# Patient Record
Sex: Female | Born: 1965 | Race: White | Hispanic: No | Marital: Married | Smoking: Current every day smoker
Health system: Southern US, Community
[De-identification: ages and names within clinical notes are randomized; demographics above are authoritative.]

## PROBLEM LIST (undated history)

## (undated) DIAGNOSIS — C801 Malignant (primary) neoplasm, unspecified: Secondary | ICD-10-CM

## (undated) DIAGNOSIS — R519 Headache, unspecified: Secondary | ICD-10-CM

## (undated) DIAGNOSIS — R51 Headache: Secondary | ICD-10-CM

## (undated) DIAGNOSIS — F32A Depression, unspecified: Secondary | ICD-10-CM

## (undated) DIAGNOSIS — I10 Essential (primary) hypertension: Secondary | ICD-10-CM

## (undated) DIAGNOSIS — T7840XA Allergy, unspecified, initial encounter: Secondary | ICD-10-CM

## (undated) DIAGNOSIS — G43909 Migraine, unspecified, not intractable, without status migrainosus: Secondary | ICD-10-CM

## (undated) DIAGNOSIS — J45909 Unspecified asthma, uncomplicated: Secondary | ICD-10-CM

## (undated) DIAGNOSIS — F329 Major depressive disorder, single episode, unspecified: Secondary | ICD-10-CM

## (undated) HISTORY — DX: Headache, unspecified: R51.9

## (undated) HISTORY — DX: Allergy, unspecified, initial encounter: T78.40XA

## (undated) HISTORY — DX: Migraine, unspecified, not intractable, without status migrainosus: G43.909

## (undated) HISTORY — DX: Headache: R51

## (undated) HISTORY — DX: Unspecified asthma, uncomplicated: J45.909

## (undated) HISTORY — DX: Depression, unspecified: F32.A

## (undated) HISTORY — DX: Major depressive disorder, single episode, unspecified: F32.9

---

## 1999-11-18 HISTORY — PX: CHOLECYSTECTOMY: SHX55

## 2009-07-05 ENCOUNTER — Ambulatory Visit: Payer: Self-pay | Admitting: Unknown Physician Specialty

## 2010-07-24 ENCOUNTER — Ambulatory Visit: Payer: Self-pay | Admitting: Unknown Physician Specialty

## 2011-10-22 ENCOUNTER — Ambulatory Visit: Payer: Self-pay | Admitting: Family Medicine

## 2012-10-26 ENCOUNTER — Ambulatory Visit: Payer: Self-pay | Admitting: Family Medicine

## 2012-11-29 ENCOUNTER — Ambulatory Visit: Payer: Self-pay

## 2012-12-09 ENCOUNTER — Ambulatory Visit: Payer: Self-pay

## 2014-12-18 LAB — HM PAP SMEAR: HM PAP: NORMAL

## 2014-12-21 LAB — HM MAMMOGRAPHY: HM Mammogram: NORMAL

## 2015-03-09 NOTE — Op Note (Signed)
PATIENT NAME:  Vickie Waters, BRINKER MR#:  [id number] DATE OF BIRTH:  [birth year]  DATE OF PROCEDURE:  12/09/2012  PREOPERATIVE DIAGNOSIS: Retained lost IUD.   POSTOPERATIVE DIAGNOSIS: Retained lost IUD.   PROCEDURES PERFORMED:  1. Dilation and curettage.  2. Hysteroscopy.   SURGEON: Wonda Cheng. Laurey Morale, M.D.   OPERATIVE FINDINGS: IUD in situ.   DESCRIPTION OF PROCEDURE: After adequate general anesthesia, the patient was prepped and draped in routine fashion. The cervix was grasped with a Jacob's tenaculum and descended to near introitus with traction. The cervix was dilated with ease. The uterine cavity was visualized with the findings of the IUD. IUD was removed. Uterine cavity was systematically curetted with return of a small amount of normal appearing tissue. The patient tolerated the procedure well and left the operating room in good condition. Sponge and needle counts were said to be correct at the end of the procedure.  ____________________________ Wonda Cheng. Laurey Morale, MD pjr:sb D: 12/09/2012 08:14:16 ET T: 12/09/2012 08:53:04 ET JOB#: [id number]  cc: Wonda Cheng. Laurey Morale, MD, <Dictator> Rosina Lowenstein MD ELECTRONICALLY SIGNED 12/22/2012 8:13

## 2015-07-12 ENCOUNTER — Encounter: Payer: Self-pay | Admitting: Primary Care

## 2015-07-12 ENCOUNTER — Ambulatory Visit (INDEPENDENT_AMBULATORY_CARE_PROVIDER_SITE_OTHER): Payer: BLUE CROSS/BLUE SHIELD | Admitting: Primary Care

## 2015-07-12 ENCOUNTER — Encounter (INDEPENDENT_AMBULATORY_CARE_PROVIDER_SITE_OTHER): Payer: Self-pay

## 2015-07-12 VITALS — BP 124/84 | HR 67 | Temp 98.5°F | Ht 65.5 in | Wt 214.8 lb

## 2015-07-12 DIAGNOSIS — F418 Other specified anxiety disorders: Secondary | ICD-10-CM

## 2015-07-12 DIAGNOSIS — F419 Anxiety disorder, unspecified: Principal | ICD-10-CM

## 2015-07-12 DIAGNOSIS — Z720 Tobacco use: Secondary | ICD-10-CM | POA: Diagnosis not present

## 2015-07-12 DIAGNOSIS — F329 Major depressive disorder, single episode, unspecified: Secondary | ICD-10-CM | POA: Insufficient documentation

## 2015-07-12 MED ORDER — CITALOPRAM HYDROBROMIDE 40 MG PO TABS: 40.0000 mg | ORAL_TABLET | Freq: Every day | ORAL | Status: DC

## 2015-07-12 MED ORDER — VENLAFAXINE HCL ER 37.5 MG PO CP24: 37.5000 mg | ORAL_CAPSULE | Freq: Every day | ORAL | Status: DC
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
## 2015-07-12 NOTE — Progress Notes (Signed)
Pre visit review using our clinic review tool, if applicable. No additional management support is needed unless otherwise documented below in the visit note. 

## 2015-07-12 NOTE — Patient Instructions (Signed)
Refills of your citalopram have been sent to the pharmacy.  Start Effexor (Venalfaxine) XR tablets for anxiety and depression. Take 1 tablet by mouth daily.  Follow up in 6 weeks for re-evaluation of anxiety and depression.  It was a pleasure to meet you today! Please don't hesitate to call me with any questions. Welcome to [facility]!

## 2015-07-12 NOTE — Assessment & Plan Note (Signed)
Smokes 1 PPD x 30 years. Is not interested in quitting at this time. Discussed the negative effects that this has on her health. Will continue to revisit.
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
## 2015-07-31 ENCOUNTER — Encounter: Payer: Self-pay | Admitting: Primary Care

## 2015-08-21 ENCOUNTER — Telehealth: Payer: Self-pay | Admitting: Primary Care

## 2015-08-21 NOTE — Telephone Encounter (Signed)
I see that she stopped the Effexor due to side effects, is her anxiety better now with just the citalopram?

## 2015-08-21 NOTE — Telephone Encounter (Signed)
Left a detail message for patient to call back.

## 2015-08-21 NOTE — Telephone Encounter (Signed)
Patient called to cancel her follow up appointment on Thursday. It was a follow up for new medication. Patient tried Effexor and it upset her stomach and she had headaches and nausea.  Patient stopped taking it after 4 days.

## 2015-08-22 ENCOUNTER — Telehealth: Payer: Self-pay | Admitting: Primary Care

## 2015-08-22 NOTE — Telephone Encounter (Signed)
Noted  

## 2015-08-22 NOTE — Telephone Encounter (Signed)
Called and ask patient regarding her anxiety. Patient stated that she is ok for now and will let us know if any changes.

## 2015-08-23 ENCOUNTER — Ambulatory Visit: Payer: BLUE CROSS/BLUE SHIELD | Admitting: Primary Care

## 2015-08-23 ENCOUNTER — Encounter: Payer: Self-pay | Admitting: Primary Care

## 2015-08-23 ENCOUNTER — Ambulatory Visit (INDEPENDENT_AMBULATORY_CARE_PROVIDER_SITE_OTHER): Payer: BLUE CROSS/BLUE SHIELD | Admitting: Primary Care

## 2015-08-23 VITALS — BP 164/108 | HR 72 | Temp 97.6°F | Ht 65.5 in | Wt 216.8 lb

## 2015-08-23 DIAGNOSIS — F418 Other specified anxiety disorders: Secondary | ICD-10-CM | POA: Diagnosis not present

## 2015-08-23 DIAGNOSIS — F32A Depression, unspecified: Secondary | ICD-10-CM

## 2015-08-23 DIAGNOSIS — R002 Palpitations: Secondary | ICD-10-CM

## 2015-08-23 DIAGNOSIS — Z720 Tobacco use: Secondary | ICD-10-CM

## 2015-08-23 DIAGNOSIS — F419 Anxiety disorder, unspecified: Secondary | ICD-10-CM

## 2015-08-23 DIAGNOSIS — F329 Major depressive disorder, single episode, unspecified: Secondary | ICD-10-CM

## 2015-08-23 DIAGNOSIS — I10 Essential (primary) hypertension: Secondary | ICD-10-CM | POA: Insufficient documentation

## 2015-08-23 MED ORDER — METOPROLOL SUCCINATE ER 25 MG PO TB24: 25.0000 mg | ORAL_TABLET | Freq: Every day | ORAL | Status: DC

## 2015-08-23 NOTE — Patient Instructions (Signed)
Your ECG looks good.  Start metoprolol succinate ER 25 mg for blood pressure and anxiety. Take 1 tablet by mouth everyday.  Check your blood pressure daily, around the same time of day, for the next 2 weeks. Ensure that you have rested for 30 minutes prior to checking your blood pressure. Record your readings and bring them to your next visit.  Follow up in 2 weeks for re-evaluation of blood pressure.  It was a pleasure to see you today!

## 2015-08-23 NOTE — Assessment & Plan Note (Addendum)
BP ranging from 160-180/90-100's over the past 3 weeks. Recent stress in her family with mother and husband. Also with palpitations intermittently which are likely stress related and possibly due to smoking. ECG performed today and is unremarkable. Will treat today with low dose beta blocker to help with anxiety, BP, and palpitations. Will watch HR. Follow up in 2 weeks for re-evaluation.

## 2015-08-23 NOTE — Progress Notes (Signed)
Pre visit review using our clinic review tool, if applicable. No additional management support is needed unless otherwise documented below in the visit note. 

## 2015-08-23 NOTE — Assessment & Plan Note (Signed)
Discussed today, she is not ready to quit.
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
## 2015-08-23 NOTE — Assessment & Plan Note (Signed)
No improvement with Effexor as she did not like the way she felt, only took for 4 days. Anxious today during exam. Recent stress with her mother and husband. BP elevated over past several weeks. Added toprol XL 25 mg to her regimen to help with palpitations, anxiety, and BP. Follow up in 2 weeks for re-evaluation.

## 2015-09-05 ENCOUNTER — Telehealth: Payer: Self-pay | Admitting: Primary Care
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
## 2015-09-05 NOTE — Telephone Encounter (Signed)
I'm happy to help her. She was supposed to follow up 2 weeks from the 6th for BP recheck and anxiety follow up. She will need to schedule an office visit for treatment. Thanks.

## 2015-09-05 NOTE — Telephone Encounter (Signed)
Called and notified patient of Kate's comments. Patient verbalized understanding.  

## 2015-09-06 ENCOUNTER — Ambulatory Visit: Payer: BLUE CROSS/BLUE SHIELD | Admitting: Primary Care

## 2015-09-14 ENCOUNTER — Ambulatory Visit (INDEPENDENT_AMBULATORY_CARE_PROVIDER_SITE_OTHER): Payer: BLUE CROSS/BLUE SHIELD | Admitting: Primary Care

## 2015-09-14 ENCOUNTER — Encounter: Payer: Self-pay | Admitting: Primary Care

## 2015-09-14 VITALS — BP 148/94 | HR 62 | Temp 97.6°F | Ht 65.5 in | Wt 210.1 lb

## 2015-09-14 DIAGNOSIS — F419 Anxiety disorder, unspecified: Secondary | ICD-10-CM

## 2015-09-14 DIAGNOSIS — I10 Essential (primary) hypertension: Secondary | ICD-10-CM

## 2015-09-14 DIAGNOSIS — F418 Other specified anxiety disorders: Secondary | ICD-10-CM | POA: Diagnosis not present

## 2015-09-14 DIAGNOSIS — F329 Major depressive disorder, single episode, unspecified: Secondary | ICD-10-CM

## 2015-09-14 DIAGNOSIS — F411 Generalized anxiety disorder: Secondary | ICD-10-CM

## 2015-09-14 MED ORDER — ALPRAZOLAM 0.5 MG PO TBDP: 0.5000 mg | ORAL_TABLET | Freq: Two times a day (BID) | ORAL | Status: DC | PRN

## 2015-09-14 MED ORDER — ALPRAZOLAM 0.5 MG PO TABS: 0.5000 mg | ORAL_TABLET | Freq: Two times a day (BID) | ORAL | Status: DC | PRN

## 2015-09-14 NOTE — Assessment & Plan Note (Signed)
Seems improved with Toprol XL based off of home readings. Slightly above goal today. Will have her send me 2 weeks of BP results in 2 weeks. If elevated, will consider adding additional medication. I believe her BP is elevated due to anxiety.

## 2015-09-14 NOTE — Patient Instructions (Addendum)
Continue taking Citalopram for anxiety and depression. Start Alprazolam 0.5 mg tablets for anxiety. Take 1 tablet by mouth twice daily as needed.  Check your blood pressure daily, around the same time of day, for the next 2 weeks.   Ensure that you have rested for 30 minutes prior to checking your blood pressure. Record your readings and send them to me on MyChart in 2 weeks.  Follow up in 2 months for re-evaluation.  It was a pleasure to see you today! Marland Kitchen
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
## 2015-11-15 ENCOUNTER — Encounter: Payer: Self-pay | Admitting: Radiology

## 2015-11-15 ENCOUNTER — Ambulatory Visit (INDEPENDENT_AMBULATORY_CARE_PROVIDER_SITE_OTHER): Payer: BLUE CROSS/BLUE SHIELD | Admitting: Primary Care

## 2015-11-15 ENCOUNTER — Encounter: Payer: Self-pay | Admitting: Primary Care

## 2015-11-15 VITALS — BP 116/62 | HR 84 | Temp 97.7°F | Wt 220.8 lb

## 2015-11-15 DIAGNOSIS — F411 Generalized anxiety disorder: Secondary | ICD-10-CM | POA: Diagnosis not present

## 2015-11-15 DIAGNOSIS — Z720 Tobacco use: Secondary | ICD-10-CM | POA: Diagnosis not present

## 2015-11-15 DIAGNOSIS — G43101 Migraine with aura, not intractable, with status migrainosus: Secondary | ICD-10-CM | POA: Diagnosis not present

## 2015-11-15 DIAGNOSIS — F418 Other specified anxiety disorders: Secondary | ICD-10-CM

## 2015-11-15 DIAGNOSIS — I10 Essential (primary) hypertension: Secondary | ICD-10-CM | POA: Diagnosis not present

## 2015-11-15 DIAGNOSIS — F329 Major depressive disorder, single episode, unspecified: Secondary | ICD-10-CM

## 2015-11-15 DIAGNOSIS — F419 Anxiety disorder, unspecified: Secondary | ICD-10-CM

## 2015-11-15 MED ORDER — ALPRAZOLAM 0.5 MG PO TABS: 0.5000 mg | ORAL_TABLET | Freq: Two times a day (BID) | ORAL | Status: DC | PRN

## 2015-11-15 MED ORDER — TOPIRAMATE 25 MG PO TABS: 25.0000 mg | ORAL_TABLET | Freq: Every day | ORAL | Status: DC

## 2015-11-15 NOTE — Assessment & Plan Note (Signed)
Stable on metoprolol succinate.  Continue same, BP and HR stable.

## 2015-11-15 NOTE — Progress Notes (Signed)
Pre visit review using our clinic review tool, if applicable. No additional management support is needed unless otherwise documented below in the visit note. 

## 2015-11-15 NOTE — Assessment & Plan Note (Signed)
Much improved since addition of PRN xanax. Uses Xanax 2-3 times weekly. Continue Citalopram 40 mg daily. UDS due today. Refill provided.

## 2015-11-15 NOTE — Assessment & Plan Note (Signed)
Stopped smoking 1 month ago, quit cold [place]. Offered my assistance if she had cravings, she agreed to notify me if needed.
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
## 2015-11-15 NOTE — Patient Instructions (Addendum)
Start Topiramate tablets for migraine prevention. Take 1 tablet by mouth every evening at bedtime.  You may continue to take the Xanax as needed for anxiety. Continue taking Citalopram 40 mg for anxiety.  Continue metoprolol succinate 25 mg daily.  Please message me in 4 weeks to update me on your migraines.  Please schedule a physical with me in March 2017. You may also schedule a lab only appointment 3-4 days prior. We will discuss your lab results in detail during your physical.  It was a pleasure to see you today!
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
## 2015-12-04 ENCOUNTER — Encounter: Payer: Self-pay | Admitting: Primary Care

## 2015-12-17 ENCOUNTER — Telehealth: Payer: Self-pay | Admitting: Primary Care

## 2015-12-17 NOTE — Telephone Encounter (Signed)
Message left for patient to return my call.  

## 2015-12-17 NOTE — Telephone Encounter (Signed)
Will you call and check on Vickie Waters. How are her migraines/headaches since we started the topamax?

## 2015-12-17 NOTE — Telephone Encounter (Signed)
Called patient earlier today. Patient stated she is on her way to a doctor appointment and cannot really talk at the moment. However, she did mention that she is doing better with the topamax but may need an increase. She will let us know for sure later when she has time.

## 2016-01-04 ENCOUNTER — Other Ambulatory Visit: Payer: Self-pay | Admitting: Primary Care

## 2016-01-04 DIAGNOSIS — I10 Essential (primary) hypertension: Secondary | ICD-10-CM

## 2016-01-04 NOTE — Telephone Encounter (Signed)
Electronically refill request for   metoprolol succinate (TOPROL XL) 25 MG 24 hr tablet   Take 1 tablet (25 mg total) by mouth daily.  Dispense: 30 tablet   Refills: 3     Last prescribed on  08/23/2015. Last seen on 11/15/15. No future appointment.

## 2016-01-14 ENCOUNTER — Other Ambulatory Visit: Payer: Self-pay | Admitting: Primary Care

## 2016-01-14 DIAGNOSIS — Z1231 Encounter for screening mammogram for malignant neoplasm of breast: Secondary | ICD-10-CM

## 2016-02-14 ENCOUNTER — Other Ambulatory Visit: Payer: Self-pay | Admitting: Primary Care

## 2016-02-14 NOTE — Telephone Encounter (Signed)
Electronically refill request for   citalopram (CELEXA) 40 MG tablet   Take 1 tablet (40 mg total) by mouth daily.  Dispense: 30 tablet   Refills: 5     Last prescribed on  07/12/2015. Last seen on 11/15/2015. CPE on 03/04/2016.

## 2016-02-24 ENCOUNTER — Other Ambulatory Visit: Payer: Self-pay | Admitting: Primary Care

## 2016-02-24 DIAGNOSIS — Z0000 Encounter for general adult medical examination without abnormal findings: Secondary | ICD-10-CM

## 2016-02-24 DIAGNOSIS — I10 Essential (primary) hypertension: Secondary | ICD-10-CM

## 2016-02-25 ENCOUNTER — Ambulatory Visit
Admission: RE | Admit: 2016-02-25 | Discharge: 2016-02-25 | Disposition: A | Discharge status: Home / Self Care | Payer: Managed Care, Other (non HMO) | Source: Ambulatory Visit | Attending: Primary Care | Admitting: Primary Care

## 2016-02-25 DIAGNOSIS — Z1231 Encounter for screening mammogram for malignant neoplasm of breast: Secondary | ICD-10-CM | POA: Insufficient documentation

## 2016-02-27 ENCOUNTER — Other Ambulatory Visit: Payer: BLUE CROSS/BLUE SHIELD

## 2016-02-27 ENCOUNTER — Other Ambulatory Visit (INDEPENDENT_AMBULATORY_CARE_PROVIDER_SITE_OTHER): Payer: Managed Care, Other (non HMO)

## 2016-02-27 DIAGNOSIS — Z0000 Encounter for general adult medical examination without abnormal findings: Secondary | ICD-10-CM | POA: Diagnosis not present

## 2016-02-27 DIAGNOSIS — I10 Essential (primary) hypertension: Secondary | ICD-10-CM

## 2016-02-27 LAB — COMPREHENSIVE METABOLIC PANEL
ALK PHOS: 81 U/L (ref 39–117)
ALT: 17 U/L (ref 0–35)
AST: 14 U/L (ref 0–37)
Albumin: 4.1 g/dL (ref 3.5–5.2)
BILIRUBIN TOTAL: 0.5 mg/dL (ref 0.2–1.2)
BUN: 12 mg/dL (ref 6–23)
CO2: 26 meq/L (ref 19–32)
Calcium: 9.3 mg/dL (ref 8.4–10.5)
Chloride: 106 mEq/L (ref 96–112)
Creatinine, Ser: 0.78 mg/dL (ref 0.40–1.20)
GFR: 83.08 mL/min (ref >60.00)
GLUCOSE: 112 mg/dL — AB (ref 70–99)
Potassium: 4.3 mEq/L (ref 3.5–5.1)
SODIUM: 137 meq/L (ref 135–145)
TOTAL PROTEIN: 7.3 g/dL (ref 6.0–8.3)

## 2016-02-27 LAB — CBC
HCT: 39.9 % (ref 36.0–46.0)
HEMOGLOBIN: 13.5 g/dL (ref 12.0–15.0)
MCHC: 33.8 g/dL (ref 30.0–36.0)
MCV: 89.6 fl (ref 78.0–100.0)
Platelets: 276 10*3/uL (ref 150.0–400.0)
RBC: 4.45 Mil/uL (ref 3.87–5.11)
RDW: 13.9 % (ref 11.5–15.5)
WBC: 8.2 10*3/uL (ref 4.0–10.5)

## 2016-02-27 LAB — LIPID PANEL
Cholesterol: 155 mg/dL (ref 0–200)
HDL: 37.8 mg/dL — ABNORMAL LOW (ref >39.00)
LDL Cholesterol: 99 mg/dL (ref 0–99)
NONHDL: 117.09
Total CHOL/HDL Ratio: 4
Triglycerides: 88 mg/dL (ref 0.0–149.0)
VLDL: 17.6 mg/dL (ref 0.0–40.0)

## 2016-02-27 LAB — HEMOGLOBIN A1C: HEMOGLOBIN A1C: 5.8 % (ref 4.6–6.5)

## 2016-02-27 LAB — VITAMIN D 25 HYDROXY (VIT D DEFICIENCY, FRACTURES): VITD: 26.29 ng/mL — ABNORMAL LOW (ref 30.00–100.00)

## 2016-03-04 ENCOUNTER — Encounter: Payer: Self-pay | Admitting: Primary Care

## 2016-03-04 ENCOUNTER — Other Ambulatory Visit (HOSPITAL_COMMUNITY)
Admission: RE | Admit: 2016-03-04 | Discharge: 2016-03-04 | Disposition: A | Discharge status: Home / Self Care | Payer: Managed Care, Other (non HMO) | Source: Ambulatory Visit | Attending: Primary Care | Admitting: Primary Care

## 2016-03-04 ENCOUNTER — Ambulatory Visit (INDEPENDENT_AMBULATORY_CARE_PROVIDER_SITE_OTHER): Payer: Managed Care, Other (non HMO) | Admitting: Primary Care

## 2016-03-04 VITALS — BP 122/74 | HR 67 | Temp 98.1°F | Ht 66.0 in | Wt 224.8 lb

## 2016-03-04 DIAGNOSIS — R7303 Prediabetes: Secondary | ICD-10-CM

## 2016-03-04 DIAGNOSIS — Z1151 Encounter for screening for human papillomavirus (HPV): Secondary | ICD-10-CM | POA: Insufficient documentation

## 2016-03-04 DIAGNOSIS — F418 Other specified anxiety disorders: Secondary | ICD-10-CM

## 2016-03-04 DIAGNOSIS — Z124 Encounter for screening for malignant neoplasm of cervix: Secondary | ICD-10-CM

## 2016-03-04 DIAGNOSIS — Z0001 Encounter for general adult medical examination with abnormal findings: Secondary | ICD-10-CM

## 2016-03-04 DIAGNOSIS — Z0000 Encounter for general adult medical examination without abnormal findings: Secondary | ICD-10-CM | POA: Insufficient documentation

## 2016-03-04 DIAGNOSIS — Z01419 Encounter for gynecological examination (general) (routine) without abnormal findings: Secondary | ICD-10-CM | POA: Insufficient documentation

## 2016-03-04 DIAGNOSIS — Z1211 Encounter for screening for malignant neoplasm of colon: Secondary | ICD-10-CM

## 2016-03-04 DIAGNOSIS — E559 Vitamin D deficiency, unspecified: Secondary | ICD-10-CM | POA: Insufficient documentation

## 2016-03-04 DIAGNOSIS — R011 Cardiac murmur, unspecified: Secondary | ICD-10-CM | POA: Diagnosis not present

## 2016-03-04 DIAGNOSIS — F329 Major depressive disorder, single episode, unspecified: Secondary | ICD-10-CM

## 2016-03-04 DIAGNOSIS — E119 Type 2 diabetes mellitus without complications: Secondary | ICD-10-CM | POA: Insufficient documentation

## 2016-03-04 DIAGNOSIS — R6889 Other general symptoms and signs: Secondary | ICD-10-CM

## 2016-03-04 DIAGNOSIS — I10 Essential (primary) hypertension: Secondary | ICD-10-CM | POA: Diagnosis not present

## 2016-03-04 DIAGNOSIS — F419 Anxiety disorder, unspecified: Secondary | ICD-10-CM

## 2016-03-04 DIAGNOSIS — Z720 Tobacco use: Secondary | ICD-10-CM

## 2016-03-04 NOTE — Assessment & Plan Note (Signed)
Quit smoking in November 2016, commended her on this accomplishment.

## 2016-03-04 NOTE — Assessment & Plan Note (Signed)
Stable today, continue metoprolol XL 25

## 2016-03-04 NOTE — Assessment & Plan Note (Signed)
Level of 26 today. Initiated vitamin D 1000 unit capsules daily. Recheck levels in 3 months.

## 2016-03-04 NOTE — Assessment & Plan Note (Signed)
A1C of 5.8.  Discussed the importance of a healthy diet and regular exercise in order for weight loss and to reduce risk of other medical diseases. Repeat labs in 3 months.

## 2016-03-04 NOTE — Progress Notes (Signed)
Subjective:    Patient ID: Vickie Waters, female    DOB: [birth year], 50 y.o.   MRN: [id number]  HPI  Vickie Waters is a 50 year old female who presents today for complete physical.  Immunizations: -Tetanus: Completed in 2009 -Influenza: Did not receive last season.    Diet: She endorses a fair diet. She's been working to improve her diet over the past 1 month. Breakfast: Oatmeal, 2 hard boiled eggs with toast, fruit Lunch: Salads, grilled chicken, [place] burger with veggies, salmon Dinner: Chicken, vegetables, limited bread, ham, salmon, wheat pasta, some microwave meals Snacks: Occasionally, fiber one bars, granola bars Desserts: Occasionally  Beverages: Water, diet pepsi  Exercise: She does not currently exercise.  Eye exam: Completed in November 2016 Dental exam: Completed 6 months Colonoscopy: Never completed, due. Pap Smear: Completed in February 2016, requesting one today. Mammogram: Completed in April 2017   Review of Systems  Constitutional: Negative for unexpected weight change.  HENT: Negative for rhinorrhea.   Respiratory: Negative for cough, shortness of breath and wheezing.   Cardiovascular: Negative for chest pain.  Gastrointestinal: Negative for diarrhea and constipation.  Genitourinary:       No period since October 2016  Musculoskeletal: Negative for myalgias and arthralgias.  Skin: Negative for rash.  Allergic/Immunologic: Negative for environmental allergies.  Neurological: Negative for dizziness, numbness and headaches.       Overall improved on Topamax  Psychiatric/Behavioral:       Stable on Citalopram.        Past Medical History  Diagnosis Date  . Asthma   . Depression   . Frequent headaches   . Allergy   . Migraine      Social History   Social History  . Marital Status: Married    Spouse Name: N/A  . Number of Children: N/A  . Years of Education: N/A   Occupational History  . Not on file.   Social History Main Topics  .  Smoking status: Former Smoker -- 1.00 packs/day    Types: Cigarettes    Quit date: 10/15/2015  . Smokeless tobacco: Not on file  . Alcohol Use: 0.0 oz/week    0 Standard drinks or equivalent per week     Comment: social  . Drug Use: Not on file  . Sexual Activity: Not on file   Other Topics Concern  . Not on file   Social History Narrative   Married.   2 children   Works as an Scientist, water quality.   Enjoys spending time with her family.       Past Surgical History  Procedure Laterality Date  . Cholecystectomy  2001    Family History  Problem Relation Age of Onset  . Arthritis Mother   . Hypertension Mother   . Hypertension Father   . Diabetes Father   . Mental illness Maternal Grandfather     Allergies  Allergen Reactions  . Cephalosporins Hives  . Penicillins Hives    Current Outpatient Prescriptions on File Prior to Visit  Medication Sig Dispense Refill  . citalopram (CELEXA) 40 MG tablet TAKE ONE TABLET BY MOUTH ONCE DAILY 90 tablet 0  . metoprolol succinate (TOPROL-XL) 25 MG 24 hr tablet TAKE ONE TABLET BY MOUTH ONCE DAILY 90 tablet 2  . topiramate (TOPAMAX) 25 MG tablet Take 1 tablet (25 mg total) by mouth at bedtime. 30 tablet 3  . ALPRAZolam (XANAX) 0.5 MG tablet Take 1 tablet (0.5 mg total) by mouth 2 (two) times  daily as needed for anxiety. (Patient not taking: Reported on 03/04/2016) 60 tablet 1   No current facility-administered medications on file prior to visit.    BP 122/74 mmHg  Pulse 67  Temp(Src) 98.1 F (36.7 C) (Oral)  Ht 5\' 6"  (1.676 m)  Wt 224 lb 12.8 oz (101.969 kg)  BMI 36.30 kg/m2  SpO2 97%    Objective:   Physical Exam  Constitutional: She is oriented to person, place, and time. She appears well-nourished.  HENT:  Right Ear: Tympanic membrane and ear canal normal.  Left Ear: Tympanic membrane and ear canal normal.  Nose: Nose normal.  Mouth/Throat: Oropharynx is clear and moist.  Eyes: Conjunctivae and EOM are normal.  Pupils are equal, round, and reactive to light.  Neck: Neck supple. No thyromegaly present.  Cardiovascular: Normal rate and regular rhythm.   Murmur heard. Pulmonary/Chest: Effort normal and breath sounds normal. She has no rales.  Abdominal: Soft. Bowel sounds are normal. There is no tenderness.  Genitourinary: Vagina normal and uterus normal. Cervix exhibits no motion tenderness and no discharge.  Musculoskeletal: Normal range of motion.  Lymphadenopathy:    She has no cervical adenopathy.  Neurological: She is alert and oriented to person, place, and time. She has normal reflexes. No cranial nerve deficit.  Skin: Skin is warm and dry. No rash noted.  Psychiatric: She has a normal mood and affect.          Assessment & Plan:

## 2016-03-04 NOTE — Assessment & Plan Note (Signed)
Tdap UTD. Pap completed today, mammogram normal in April 2017. Colonoscopy referral placed. Exam unremarkable except for cardiac murmur. Labs with vitamin D deficiency and borderline diabetes which were addressed. Discussed the importance of a healthy diet and regular exercise in order for weight loss and to reduce risk of other medical diseases.  Follow up in 1 year for repeat physical.  Repeat A1C and vitamin D due in 3 months.

## 2016-03-04 NOTE — Progress Notes (Signed)
Pre visit review using our clinic review tool, if applicable. No additional management support is needed unless otherwise documented below in the visit note. 

## 2016-03-04 NOTE — Assessment & Plan Note (Signed)
Noted during examination today with sitting and supine positions. No prior history. Will obtain echocardiogram for further evaluation. She is asymptomatic.

## 2016-03-04 NOTE — Patient Instructions (Addendum)
Start Vitamin D 1000 unit capsules daily for low vitamin D levels.  Stop by the front desk and speak with either Rosaria Ferries or Ebony Hail regarding your Echocardiogram.  You will be contacted regarding your referral to GI for your colonoscopy.  Please let us know if you have not heard back within one week.   Congratulations on smoking cessation and weight loss efforts.  Continue your efforts towards weight loss.  Scheduled a lab only appointment in 3 months to recheck your blood sugar levels (A1C),  We will notify you of your PAP results once received.   Follow up in 1 year for repeat physical.   It was a pleasure to see you today!

## 2016-03-04 NOTE — Addendum Note (Signed)
Addended by: Jacqualin Combes on: 03/04/2016 03:21 PM   Modules accepted: Orders, SmartSet

## 2016-03-04 NOTE — Assessment & Plan Note (Signed)
Well managed on Citalopram, no recent use of alprazolam.

## 2016-03-05 LAB — CYTOLOGY - PAP

## 2016-03-14 ENCOUNTER — Ambulatory Visit (INDEPENDENT_AMBULATORY_CARE_PROVIDER_SITE_OTHER): Payer: Managed Care, Other (non HMO)

## 2016-03-14 ENCOUNTER — Other Ambulatory Visit: Payer: Self-pay

## 2016-03-14 DIAGNOSIS — R011 Cardiac murmur, unspecified: Secondary | ICD-10-CM | POA: Diagnosis not present

## 2016-03-17 ENCOUNTER — Other Ambulatory Visit: Payer: Self-pay | Admitting: Primary Care

## 2016-03-17 NOTE — Telephone Encounter (Deleted)
Electronically refill request for   topiramate (TOPAMAX) 25 MG tablet

## 2016-04-15 ENCOUNTER — Ambulatory Visit (INDEPENDENT_AMBULATORY_CARE_PROVIDER_SITE_OTHER): Payer: Managed Care, Other (non HMO) | Admitting: Internal Medicine

## 2016-04-15 ENCOUNTER — Encounter: Payer: Self-pay | Admitting: Internal Medicine

## 2016-04-15 VITALS — BP 128/80 | HR 97 | Temp 99.0°F | Wt 223.0 lb

## 2016-04-15 DIAGNOSIS — J069 Acute upper respiratory infection, unspecified: Secondary | ICD-10-CM | POA: Diagnosis not present

## 2016-04-15 DIAGNOSIS — B9789 Other viral agents as the cause of diseases classified elsewhere: Principal | ICD-10-CM

## 2016-04-15 MED ORDER — ALBUTEROL SULFATE HFA 108 (90 BASE) MCG/ACT IN AERS: 2.0000 | INHALATION_SPRAY | Freq: Four times a day (QID) | RESPIRATORY_TRACT | Status: DC | PRN

## 2016-04-15 MED ORDER — HYDROCODONE-HOMATROPINE 5-1.5 MG/5ML PO SYRP
5.0000 mL | ORAL_SOLUTION | Freq: Three times a day (TID) | ORAL | Status: DC | PRN
Start: 2016-04-15 — End: 2016-06-06

## 2016-04-15 NOTE — Progress Notes (Signed)
Pre visit review using our clinic review tool, if applicable. No additional management support is needed unless otherwise documented below in the visit note. 

## 2016-04-15 NOTE — Patient Instructions (Signed)

## 2016-04-15 NOTE — Progress Notes (Signed)
HPI  Pt presents to the clinic today with c/o cough and shortness of breath. This started 2-3 days ago. The cough is non productive. She has noticed that she has been wheezing. She has run fever up to 100.1. She denies chills or body aches but has felt fatigued. She denies runny nose, nasal congestion, ear pain or sore throat. She has taken Mucinex and used her sones nebulizer treatment with minimal relief. She has a history of allergies and asthma. She has not had sick contacts.  Review of Systems      Past Medical History  Diagnosis Date  . Asthma   . Depression   . Frequent headaches   . Allergy   . Migraine     Family History  Problem Relation Age of Onset  . Arthritis Mother   . Hypertension Mother   . Hypertension Father   . Diabetes Father   . Mental illness Maternal Grandfather     Social History   Social History  . Marital Status: Married    Spouse Name: N/A  . Number of Children: N/A  . Years of Education: N/A   Occupational History  . Not on file.   Social History Main Topics  . Smoking status: Former Smoker -- 1.00 packs/day    Types: Cigarettes    Quit date: 10/15/2015  . Smokeless tobacco: Not on file  . Alcohol Use: 0.0 oz/week    0 Standard drinks or equivalent per week     Comment: social  . Drug Use: Not on file  . Sexual Activity: Not on file   Other Topics Concern  . Not on file   Social History Narrative   Married.   2 children   Works as an Scientist, water quality.   Enjoys spending time with her family.       Allergies  Allergen Reactions  . Cephalosporins Hives  . Penicillins Hives     Constitutional: Positive headache, fatigue and fever. Denies abrupt weight changes.  HEENT:  Denies eye redness, eye pain, pressure behind the eyes, facial pain, nasal congestion, ear pain, ringing in the ears, wax buildup, runny nose or sore throat. Respiratory: Positive cough and shortness of breath. Denies difficulty breathing.  Cardiovascular:  Denies chest pain, chest tightness, palpitations or swelling in the hands or feet.   No other specific complaints in a complete review of systems (except as listed in HPI above).  Objective:   BP 128/80 mmHg  Pulse 97  Temp(Src) 99 F (37.2 C) (Oral)  Wt 223 lb (101.152 kg)  SpO2 96%  Wt Readings from Last 3 Encounters:  04/15/16 223 lb (101.152 kg)  03/04/16 224 lb 12.8 oz (101.969 kg)  11/15/15 220 lb 12 oz (100.132 kg)     General: Appears her stated age, in NAD. HEENT: Head: normal shape and size, no sinus tenderness noted; Eyes: sclera white, no icterus, conjunctiva pink; Ears: Tm's gray and intact, normal light reflex; Throat/Mouth: Teeth present, mucosa pink and moist, no exudate noted, no lesions or ulcerations noted.  Neck: No cervical lymphadenopathy.  Cardiovascular: Normal rate and rhythm. S1,S2 noted. Murmur noted. Pulmonary/Chest: Normal effort and positive vesicular breath sounds. No respiratory distress. No wheezes, rales or ronchi noted.      Assessment & Plan:   Viral Upper Respiratory Infection with Cough:  Get some rest and drink plenty of water Try Allegra OTC eRx for Albuterol inhaler Rx for Hycodan cough syrup Return precautions given  RTC as needed or if symptoms persist.

## 2016-05-27 ENCOUNTER — Other Ambulatory Visit: Payer: Self-pay | Admitting: Primary Care

## 2016-05-27 DIAGNOSIS — E559 Vitamin D deficiency, unspecified: Secondary | ICD-10-CM

## 2016-05-27 DIAGNOSIS — R7303 Prediabetes: Secondary | ICD-10-CM

## 2016-06-03 ENCOUNTER — Other Ambulatory Visit (INDEPENDENT_AMBULATORY_CARE_PROVIDER_SITE_OTHER): Payer: Managed Care, Other (non HMO)

## 2016-06-03 DIAGNOSIS — E559 Vitamin D deficiency, unspecified: Secondary | ICD-10-CM

## 2016-06-03 DIAGNOSIS — R7303 Prediabetes: Secondary | ICD-10-CM

## 2016-06-03 LAB — VITAMIN D 25 HYDROXY (VIT D DEFICIENCY, FRACTURES): VITD: 35.09 ng/mL (ref 30.00–100.00)

## 2016-06-03 LAB — HEMOGLOBIN A1C: Hgb A1c MFr Bld: 6.6 % — ABNORMAL HIGH (ref 4.6–6.5)

## 2016-06-06 ENCOUNTER — Ambulatory Visit (INDEPENDENT_AMBULATORY_CARE_PROVIDER_SITE_OTHER): Payer: Managed Care, Other (non HMO) | Admitting: Primary Care

## 2016-06-06 ENCOUNTER — Encounter: Payer: Self-pay | Admitting: Primary Care

## 2016-06-06 VITALS — BP 118/76 | HR 70 | Temp 97.7°F | Ht 66.0 in | Wt 229.4 lb

## 2016-06-06 DIAGNOSIS — F418 Other specified anxiety disorders: Secondary | ICD-10-CM | POA: Diagnosis not present

## 2016-06-06 DIAGNOSIS — F419 Anxiety disorder, unspecified: Secondary | ICD-10-CM

## 2016-06-06 DIAGNOSIS — F329 Major depressive disorder, single episode, unspecified: Secondary | ICD-10-CM

## 2016-06-06 DIAGNOSIS — E119 Type 2 diabetes mellitus without complications: Secondary | ICD-10-CM

## 2016-06-06 MED ORDER — GLUCOSE BLOOD VI STRP: ORAL_STRIP | Status: DC

## 2016-06-06 MED ORDER — ONETOUCH FINEPOINT LANCETS MISC: Status: DC

## 2016-06-06 MED ORDER — ONETOUCH ULTRA MINI W/DEVICE KIT: PACK | Status: AC

## 2016-06-06 NOTE — Patient Instructions (Signed)
You have type 2 diabetes with an A1C of 6.6.   You can reduce this by working on improvements in your diet. Please limit carbohydrates in the form of white bread, rice, pasta, cakes, cookies, sugary drinks, etc. Increase your consumption of fresh fruits and vegetables, fruit, lean protein, whole grains.  Ensure you are consuming 64 ounces of water daily.  Start exercising. You should be getting 1 hour of moderate intensity exercise 5 days weekly.  Schedule a lab only appointment in 3 months for repeat A1C.  It was a pleasure to see you today!  Diabetes Mellitus and Food It is important for you to manage your blood sugar (glucose) level. Your blood glucose level can be greatly affected by what you eat. Eating healthier foods in the appropriate amounts throughout the day at about the same time each day will help you control your blood glucose level. It can also help slow or prevent worsening of your diabetes mellitus. Healthy eating may even help you improve the level of your blood pressure and reach or maintain a healthy weight.  General recommendations for healthful eating and cooking habits include:  Eating meals and snacks regularly. Avoid going long periods of time without eating to lose weight.  Eating a diet that consists mainly of plant-based foods, such as fruits, vegetables, nuts, legumes, and whole grains.  Using low-heat cooking methods, such as baking, instead of high-heat cooking methods, such as deep frying. Work with your dietitian to make sure you understand how to use the Nutrition Facts information on food labels. HOW CAN FOOD AFFECT ME? Carbohydrates Carbohydrates affect your blood glucose level more than any other type of food. Your dietitian will help you determine how many carbohydrates to eat at each meal and teach you how to count carbohydrates. Counting carbohydrates is important to keep your blood glucose at a healthy level, especially if you are using insulin or  taking certain medicines for diabetes mellitus. Alcohol Alcohol can cause sudden decreases in blood glucose (hypoglycemia), especially if you use insulin or take certain medicines for diabetes mellitus. Hypoglycemia can be a life-threatening condition. Symptoms of hypoglycemia (sleepiness, dizziness, and disorientation) are similar to symptoms of having too much alcohol.  If your health care provider has given you approval to drink alcohol, do so in moderation and use the following guidelines:  Women should not have more than one drink per day, and men should not have more than two drinks per day. One drink is equal to:  12 oz of beer.  5 oz of wine.  1 oz of hard liquor.  Do not drink on an empty stomach.  Keep yourself hydrated. Have water, diet soda, or unsweetened iced tea.  Regular soda, juice, and other mixers might contain a lot of carbohydrates and should be counted. WHAT FOODS ARE NOT RECOMMENDED? As you make food choices, it is important to remember that all foods are not the same. Some foods have fewer nutrients per serving than other foods, even though they might have the same number of calories or carbohydrates. It is difficult to get your body what it needs when you eat foods with fewer nutrients. Examples of foods that you should avoid that are high in calories and carbohydrates but low in nutrients include:  Trans fats (most processed foods list trans fats on the Nutrition Facts label).  Regular soda.  Juice.  Candy.  Sweets, such as cake, pie, doughnuts, and cookies.  Fried foods. WHAT FOODS CAN I EAT? Eat nutrient-rich  foods, which will nourish your body and keep you healthy. The food you should eat also will depend on several factors, including:  The calories you need.  The medicines you take.  Your weight.  Your blood glucose level.  Your blood pressure level.  Your cholesterol level. You should eat a variety of foods, including:  Protein.  Lean  cuts of meat.  Proteins low in saturated fats, such as fish, egg whites, and beans. Avoid processed meats.  Fruits and vegetables.  Fruits and vegetables that may help control blood glucose levels, such as apples, mangoes, and yams.  Dairy products.  Choose fat-free or low-fat dairy products, such as milk, yogurt, and cheese.  Grains, bread, pasta, and rice.  Choose whole grain products, such as multigrain bread, whole oats, and brown rice. These foods may help control blood pressure.  Fats.  Foods containing healthful fats, such as nuts, avocado, olive oil, canola oil, and fish. DOES EVERYONE WITH DIABETES MELLITUS HAVE THE SAME MEAL PLAN? Because every person with diabetes mellitus is different, there is not one meal plan that works for everyone. It is very important that you meet with a dietitian who will help you create a meal plan that is just right for you.   This information is not intended to replace advice given to you by your health care provider. Make sure you discuss any questions you have with your health care provider.   Document Released: 07/31/2005 Document Revised: 11/24/2014 Document Reviewed: 09/30/2013 Elsevier Interactive Patient Education 2016 [employer]  Type 2 Diabetes Mellitus, Adult Type 2 diabetes mellitus, often simply referred to as type 2 diabetes, is a long-lasting (chronic) disease. In type 2 diabetes, the pancreas does not make enough insulin (a hormone), the cells are less responsive to the insulin that is made (insulin resistance), or both. Normally, insulin moves sugars from food into the tissue cells. The tissue cells use the sugars for energy. The lack of insulin or the lack of normal response to insulin causes excess sugars to build up in the blood instead of going into the tissue cells. As a result, high blood sugar (hyperglycemia) develops. The effect of high sugar (glucose) levels can cause many complications. Type 2 diabetes was also  previously called adult-onset diabetes, but it can occur at any age.  RISK FACTORS  A person is predisposed to developing type 2 diabetes if someone in the family has the disease and also has one or more of the following primary risk factors:  Weight gain, or being overweight or obese.  An inactive lifestyle.  A history of consistently eating high-calorie foods. Maintaining a normal weight and regular physical activity can reduce the chance of developing type 2 diabetes. SYMPTOMS  A person with type 2 diabetes may not show symptoms initially. The symptoms of type 2 diabetes appear slowly. The symptoms include:  Increased thirst (polydipsia).  Increased urination (polyuria).  Increased urination during the night (nocturia).  Sudden or unexplained weight changes.  Frequent, recurring infections.  Tiredness (fatigue).  Weakness.  Vision changes, such as blurred vision.  Fruity smell to your breath.  Abdominal pain.  Nausea or vomiting.  Cuts or bruises which are slow to heal.  Tingling or numbness in the hands or feet.  An open skin wound (ulcer). DIAGNOSIS Type 2 diabetes is frequently not diagnosed until complications of diabetes are present. Type 2 diabetes is diagnosed when symptoms or complications are present and when blood glucose levels are increased. Your blood glucose level  may be checked by one or more of the following blood tests:  A fasting blood glucose test. You will not be allowed to eat for at least 8 hours before a blood sample is taken.  A random blood glucose test. Your blood glucose is checked at any time of the day regardless of when you ate.  A hemoglobin A1c blood glucose test. A hemoglobin A1c test provides information about blood glucose control over the previous 3 months.  An oral glucose tolerance test (OGTT). Your blood glucose is measured after you have not eaten (fasted) for 2 hours and then after you drink a glucose-containing  beverage. TREATMENT   You may need to take insulin or diabetes medicine daily to keep blood glucose levels in the desired range.  If you use insulin, you may need to adjust the dosage depending on the carbohydrates that you eat with each meal or snack.  Lifestyle changes are recommended as part of your treatment. These may include:  Following an individualized diet plan developed by a nutritionist or dietitian.  Exercising daily. Your health care providers will set individualized treatment goals for you based on your age, your medicines, how long you have had diabetes, and any other medical conditions you have. Generally, the goal of treatment is to maintain the following blood glucose levels:  Before meals (preprandial): 80-130 mg/dL.  After meals (postprandial): below 180 mg/dL.  A1c: less than 6.5-7%. HOME CARE INSTRUCTIONS   Have your hemoglobin A1c level checked twice a year.  Perform daily blood glucose monitoring as directed by your health care provider.  Monitor urine ketones when you are ill and as directed by your health care provider.  Take your diabetes medicine or insulin as directed by your health care provider to maintain your blood glucose levels in the desired range.  Never run out of diabetes medicine or insulin. It is needed every day.  If you are using insulin, you may need to adjust the amount of insulin given based on your intake of carbohydrates. Carbohydrates can raise blood glucose levels but need to be included in your diet. Carbohydrates provide vitamins, minerals, and fiber which are an essential part of a healthy diet. Carbohydrates are found in fruits, vegetables, whole grains, dairy products, legumes, and foods containing added sugars.  Eat healthy foods. You should make an appointment to see a registered dietitian to help you create an eating plan that is right for you.  Lose weight if you are overweight.  Carry a medical alert card or wear your  medical alert jewelry.  Carry a 15-gram carbohydrate snack with you at all times to treat low blood glucose (hypoglycemia). Some examples of 15-gram carbohydrate snacks include:  Glucose tablets, 3 or 4.  Glucose gel, 15-gram tube.  Raisins, 2 tablespoons (24 grams).  Jelly beans, 6.  Animal crackers, 8.  Regular pop, 4 ounces (120 mL).  Gummy treats, 9.  Recognize hypoglycemia. Hypoglycemia occurs with blood glucose levels of 70 mg/dL and below. The risk for hypoglycemia increases when fasting or skipping meals, during or after intense exercise, and during sleep. Hypoglycemia symptoms can include:  Tremors or shakes.  Decreased ability to concentrate.  Sweating.  Increased heart rate.  Headache.  Dry mouth.  Hunger.  Irritability.  Anxiety.  Restless sleep.  Altered speech or coordination.  Confusion.  Treat hypoglycemia promptly. If you are alert and able to safely swallow, follow the 15:15 rule:  Take 15-20 grams of rapid-acting glucose or carbohydrate. Rapid-acting options include  glucose gel, glucose tablets, or 4 ounces (120 mL) of fruit juice, regular soda, or low-fat milk.  Check your blood glucose level 15 minutes after taking the glucose.  Take 15-20 grams more of glucose if the repeat blood glucose level is still 70 mg/dL or below.  Eat a meal or snack within 1 hour once blood glucose levels return to normal.  Be alert to feeling very thirsty and urinating more frequently than usual, which are early signs of hyperglycemia. An early awareness of hyperglycemia allows for prompt treatment. Treat hyperglycemia as directed by your health care provider.  Engage in at least 150 minutes of moderate-intensity physical activity a week, spread over at least 3 days of the week or as directed by your health care provider. In addition, you should engage in resistance exercise at least 2 times a week or as directed by your health care provider. Try to spend no more  than 90 minutes at one time inactive.  Adjust your medicine and food intake as needed if you start a new exercise or sport.  Follow your sick-day plan anytime you are unable to eat or drink as usual.  Do not use any tobacco products including cigarettes, chewing tobacco, or electronic cigarettes. If you need help quitting, ask your health care provider.  Limit alcohol intake to no more than 1 drink per day for nonpregnant women and 2 drinks per day for men. You should drink alcohol only when you are also eating food. Talk with your health care provider whether alcohol is safe for you. Tell your health care provider if you drink alcohol several times a week.  Keep all follow-up visits as directed by your health care provider. This is important.  Schedule an eye exam soon after the diagnosis of type 2 diabetes and then annually.  Perform daily skin and foot care. Examine your skin and feet daily for cuts, bruises, redness, nail problems, bleeding, blisters, or sores. A foot exam by a health care provider should be done annually.  Brush your teeth and gums at least twice a day and floss at least once a day. Follow up with your dentist regularly.  Share your diabetes management plan with your workplace or school.  Keep your immunizations up to date. It is recommended that you receive a flu (influenza) vaccine every year. It is also recommended that you receive a pneumonia (pneumococcal) vaccine. If you are 88 years of age or older and have never received a pneumonia vaccine, this vaccine may be given as a series of two separate shots. Ask your health care provider which additional vaccines may be recommended.  Learn to manage stress.  Obtain ongoing diabetes education and support as needed.  Participate in or seek rehabilitation as needed to maintain or improve independence and quality of life. Request a physical or occupational therapy referral if you are having foot or hand numbness, or  difficulties with grooming, dressing, eating, or physical activity. SEEK MEDICAL CARE IF:   You are unable to eat food or drink fluids for more than 6 hours.  You have nausea and vomiting for more than 6 hours.  Your blood glucose level is over 240 mg/dL.  There is a change in mental status.  You develop an additional serious illness.  You have diarrhea for more than 6 hours.  You have been sick or have had a fever for a couple of days and are not getting better.  You have pain during any physical activity.  SEEK  IMMEDIATE MEDICAL CARE IF:  You have difficulty breathing.  You have moderate to large ketone levels.   This information is not intended to replace advice given to you by your health care provider. Make sure you discuss any questions you have with your health care provider.   Document Released: 11/03/2005 Document Revised: 07/25/2015 Document Reviewed: 06/01/2012 Elsevier Interactive Patient Education [employer].

## 2016-06-06 NOTE — Assessment & Plan Note (Signed)
New diagnosis from recent A1c of 6.6, history of gestational diabetes. Prediabetes on several prior visits. Poor diet and does not exercise. She is motivated to improve her diet and start exercising in order to lower A1c. Will defer treatment for metformin at this time and give her 3 months to reduce A1c. Recheck A1c in 3 months, if no improvement or worse will initiate metformin.. Prior lipid panel stable. We'll recheck in 3 months.

## 2016-06-06 NOTE — Assessment & Plan Note (Signed)
Handle news of new diabetes diagnosis very well and is accepting of her new condition.

## 2016-06-06 NOTE — Progress Notes (Signed)
Subjective:    Patient ID: Vickie Waters, female    DOB: 1966/07/12, 50 y.o.   MRN: [id number]  HPI  Vickie Waters is a 50 year old female who presents today to discuss diabetes. She was recently diagnosed with diabetes several days ago after a repeat A1C check of prediabetes we found 3 months ago. Her recent A1C was 6.6.   She's been on vacation numerous times over the summer and has neglected her diet. She's been eating fast food, drinking sweet tea, and has had numerous servings of ice cream and still she's. She had a history of gestational diabetes during pregnancy. Denies numbness/tingling in hands and feet. She is due for an eye exam in November.  Her diet currently consists of: Breakfast: Muffins, biscuits, donuts Lunch: Fast food, junk food Dinner: Pasta, fries, potatoes, meat, vegetables Desserts: Daily Beverages: Diet pepsi, sweet tea  Exercise: She does not currently exercise  She is motivated today to change her diet to prevent worsening diabetes. She does not wish to go on medication at this time as she believes she can control her diabetes with diet and exercise.  Review of Systems  Respiratory: Negative for shortness of breath.   Cardiovascular: Negative for chest pain.  Endocrine: Negative for polydipsia and polyuria.  Neurological: Negative for dizziness and numbness.       Past Medical History  Diagnosis Date  . Asthma   . Depression   . Frequent headaches   . Allergy   . Migraine      Social History   Social History  . Marital Status: Married    Spouse Name: N/A  . Number of Children: N/A  . Years of Education: N/A   Occupational History  . Not on file.   Social History Main Topics  . Smoking status: Former Smoker -- 1.00 packs/day    Types: Cigarettes    Quit date: 10/15/2015  . Smokeless tobacco: Not on file  . Alcohol Use: 0.0 oz/week    0 Standard drinks or equivalent per week     Comment: social  . Drug Use: Not on file  . Sexual  Activity: Not on file   Other Topics Concern  . Not on file   Social History Narrative   Married.   2 children   Works as an Scientist, water quality.   Enjoys spending time with her family.       Past Surgical History  Procedure Laterality Date  . Cholecystectomy  2001    Family History  Problem Relation Age of Onset  . Arthritis Mother   . Hypertension Mother   . Hypertension Father   . Diabetes Father   . Mental illness Maternal Grandfather     Allergies  Allergen Reactions  . Cephalosporins Hives  . Penicillins Hives    Current Outpatient Prescriptions on File Prior to Visit  Medication Sig Dispense Refill  . citalopram (CELEXA) 40 MG tablet TAKE ONE TABLET BY MOUTH ONCE DAILY 90 tablet 0  . metoprolol succinate (TOPROL-XL) 25 MG 24 hr tablet TAKE ONE TABLET BY MOUTH ONCE DAILY 90 tablet 2  . topiramate (TOPAMAX) 25 MG tablet TAKE ONE TABLET BY MOUTH AT BEDTIME 30 tablet 3   No current facility-administered medications on file prior to visit.    BP 118/76 mmHg  Pulse 70  Temp(Src) 97.7 F (36.5 C) (Oral)  Ht 5\' 6"  (1.676 m)  Wt 229 lb 6.4 oz (104.055 kg)  BMI 37.04 kg/m2  SpO2 98%  Objective:   Physical Exam  Constitutional: She appears well-nourished.  Cardiovascular: Normal rate and regular rhythm.   Pulmonary/Chest: Effort normal and breath sounds normal.  Skin: Skin is warm and dry.  Psychiatric: She has a normal mood and affect.          Assessment & Plan:

## 2016-06-06 NOTE — Progress Notes (Signed)
Pre visit review using our clinic review tool, if applicable. No additional management support is needed unless otherwise documented below in the visit note. 

## 2016-06-09 MED ORDER — ONETOUCH ULTRASOFT LANCETS MISC: 11 refills | Status: DC

## 2016-06-09 MED ORDER — LANCET DEVICE MISC: 11 refills | Status: DC

## 2016-06-09 NOTE — Addendum Note (Signed)
Addended by: Jacqualin Combes on: 06/09/2016 10:37 AM   Modules accepted: Orders

## 2016-06-09 NOTE — Addendum Note (Signed)
Addended by: Jacqualin Combes on: 06/09/2016 12:11 PM   Modules accepted: Orders

## 2016-06-16 ENCOUNTER — Other Ambulatory Visit: Payer: Self-pay | Admitting: Primary Care

## 2016-06-16 MED ORDER — ONETOUCH DELICA LANCING DEV MISC: 11 refills | Status: AC

## 2016-06-16 NOTE — Telephone Encounter (Signed)
Received faxed request for refill of OneTouch Delica 30 Gauge fine.  Already sent request in to the pharmacy.

## 2016-06-17 MED ORDER — ONETOUCH DELICA LANCETS FINE MISC: 11 refills | Status: AC

## 2016-06-17 NOTE — Telephone Encounter (Signed)
Christy from [address] left v/m received onetouch delica device not lancets; new rx for lancets sent as instructed.

## 2016-07-25 ENCOUNTER — Other Ambulatory Visit: Payer: Self-pay | Admitting: Primary Care

## 2016-08-19 ENCOUNTER — Other Ambulatory Visit: Payer: Self-pay | Admitting: Primary Care

## 2016-08-19 DIAGNOSIS — F329 Major depressive disorder, single episode, unspecified: Secondary | ICD-10-CM

## 2016-08-19 DIAGNOSIS — F419 Anxiety disorder, unspecified: Principal | ICD-10-CM

## 2016-08-19 NOTE — Telephone Encounter (Signed)
Ok to refill? Electronically refill request for   citalopram (CELEXA) 40 MG tablet  Last prescribed on 03/17/2016. Last seen on 06/06/2016.

## 2016-08-26 ENCOUNTER — Encounter: Payer: Self-pay | Admitting: Primary Care

## 2016-09-19 ENCOUNTER — Other Ambulatory Visit (INDEPENDENT_AMBULATORY_CARE_PROVIDER_SITE_OTHER): Payer: Managed Care, Other (non HMO)

## 2016-09-19 DIAGNOSIS — E119 Type 2 diabetes mellitus without complications: Secondary | ICD-10-CM | POA: Diagnosis not present

## 2016-09-19 LAB — HEMOGLOBIN A1C: Hgb A1c MFr Bld: 5.8 % (ref 4.6–6.5)

## 2016-09-22 ENCOUNTER — Telehealth: Payer: Self-pay | Admitting: Primary Care

## 2016-09-22 NOTE — Telephone Encounter (Signed)
Noted. Patient viewed results and Kate's comments on MyChart.  

## 2016-09-22 NOTE — Telephone Encounter (Signed)
Pt would like a call about labs,  she says that she does not go on my chart.   Thanks

## 2016-10-14 ENCOUNTER — Other Ambulatory Visit: Payer: Self-pay | Admitting: Primary Care

## 2016-10-14 DIAGNOSIS — I10 Essential (primary) hypertension: Secondary | ICD-10-CM

## 2016-10-31 LAB — HM DIABETES EYE EXAM

## 2016-11-07 ENCOUNTER — Other Ambulatory Visit: Payer: Self-pay | Admitting: Primary Care

## 2016-11-07 DIAGNOSIS — R519 Headache, unspecified: Secondary | ICD-10-CM

## 2016-11-07 DIAGNOSIS — R51 Headache: Principal | ICD-10-CM

## 2017-01-27 ENCOUNTER — Telehealth: Payer: Self-pay | Admitting: Primary Care

## 2017-01-27 NOTE — Telephone Encounter (Signed)
Please get more information. What's going on? Is she taking her citalopram?

## 2017-01-27 NOTE — Telephone Encounter (Signed)
Pt called, she is requesting a callback, she needs refill for alprazolam, and can not make appt.  Her husband is in hospital and she can not leave hospital.   She is requesting cb from [place]. Thanks

## 2017-01-28 ENCOUNTER — Ambulatory Visit (INDEPENDENT_AMBULATORY_CARE_PROVIDER_SITE_OTHER): Payer: Managed Care, Other (non HMO) | Admitting: Primary Care

## 2017-01-28 ENCOUNTER — Other Ambulatory Visit: Payer: Self-pay | Admitting: Primary Care

## 2017-01-28 ENCOUNTER — Encounter: Payer: Self-pay | Admitting: Primary Care

## 2017-01-28 VITALS — BP 146/96 | HR 75 | Temp 98.4°F | Ht 66.0 in | Wt 229.0 lb

## 2017-01-28 DIAGNOSIS — F32A Depression, unspecified: Secondary | ICD-10-CM

## 2017-01-28 DIAGNOSIS — F329 Major depressive disorder, single episode, unspecified: Secondary | ICD-10-CM

## 2017-01-28 DIAGNOSIS — F419 Anxiety disorder, unspecified: Principal | ICD-10-CM

## 2017-01-28 DIAGNOSIS — F418 Other specified anxiety disorders: Secondary | ICD-10-CM | POA: Diagnosis not present

## 2017-01-28 DIAGNOSIS — R519 Headache, unspecified: Secondary | ICD-10-CM

## 2017-01-28 DIAGNOSIS — R51 Headache: Principal | ICD-10-CM

## 2017-01-28 MED ORDER — BUSPIRONE HCL 7.5 MG PO TABS
7.5000 mg | ORAL_TABLET | Freq: Two times a day (BID) | ORAL | 1 refills | Status: DC
Start: 2017-01-28 — End: 2017-05-12

## 2017-01-28 MED ORDER — ALPRAZOLAM 0.25 MG PO TABS: ORAL_TABLET | ORAL | 0 refills | Status: DC

## 2017-01-28 NOTE — Telephone Encounter (Signed)
PT walked into clinic today in visible distress. She stated she called yesterday on the verge of a breakdown and hasn't heard back anything. PT is still taking her Citalopram. Only takes alprazolam PRN and states she hasn't taken one in 7 months. Her husband is in the hospital and needs to get back to him. He has been there since 02-19-23. 2023/02/19 morning husband had a massive seizure and is having a lot of stress. She states he practically died on 19-Feb-2023 morning. She has a lot on her plate and is trying to handle it all on her own. Does not want to have to go to the ER or anywhere so she can continue to be with her husband. Does not want to hurt herself or anyone else.

## 2017-01-28 NOTE — Telephone Encounter (Signed)
Office visit on 01/28/2017

## 2017-01-28 NOTE — Progress Notes (Signed)
Pre visit review using our clinic review tool, if applicable. No additional management support is needed unless otherwise documented below in the visit note. 

## 2017-01-28 NOTE — Assessment & Plan Note (Signed)
Uncontrolled over the last several months, worse this past weekend.  GAD 7 score and PHQ 9 score of 15 today.  Continue Citalopram 40 mg. Add in Buspar 7.5 mg BID. Given recent events with difficulty coping, will prescribe short term Alprazolam, discussed to use sparingly. Follow up in 3 months for re-evaluation. She contracts to safety today.

## 2017-01-28 NOTE — Telephone Encounter (Signed)
Ok to refill? Electronically refill request for topiramate (TOPAMAX) 25 MG tablet. Last prescribed on 11/07/2016.

## 2017-01-28 NOTE — Patient Instructions (Addendum)
Continue citalopram 40 mg tablets for anxiety and depression.   Start buspirone 7.5 mg tablets for anxiety, take 1 tablet by mouth twice daily.   You may take the Xanax as needed for panic attacks. Use this medication sparingly.   You MUST get some rest, you will feel better.  Please schedule a follow up visit in 3 months for re-evaluation of anxiety, depression, and diabetes.   It was a pleasure to see you today!

## 2017-01-28 NOTE — Telephone Encounter (Signed)
Patient evaluated this morning.

## 2017-01-28 NOTE — Progress Notes (Signed)
Subjective:    Patient ID: Vickie Waters, female    DOB: [birth year], 51 y.o.   MRN: [id number]  HPI  Vickie Waters is a 51 year old female with a history of anxiety and depression who presents today with a chief complaint of anxiety and depression. Her husband recently suffered from a seizure this past Saturday which caused cardiac arrest. He was revived and is recovering at the hospital. She's been going through a 1-2 year journey with her husband due to episodes of syncope which has caused a lot of stress.   Over the past several days she's not slept, feels like she's on the verge of a mental breakdown, has felt very stressed given her husbands illness, and has not left the hospital. Before this incident she's felt depressed, anxious, experienced daily worry; this has been worse since her husbands recent illness. She is compliant to her Citalopram and does feel as though it has helped but is not enough. GAD 7 score of 15 and PHQ 9 score of 15 today. She has felt suicidal thoughts a few times, denies today and contracts to safety.   Review of Systems  Constitutional: Positive for fatigue.  Respiratory: Positive for chest tightness. Negative for shortness of breath.   Cardiovascular: Negative for chest pain and palpitations.  Psychiatric/Behavioral: Positive for sleep disturbance. Negative for suicidal ideas. The patient is nervous/anxious.        See HPI       Past Medical History:  Diagnosis Date  . Allergy   . Asthma   . Depression   . Frequent headaches   . Migraine      Social History   Social History  . Marital status: Married    Spouse name: N/A  . Number of children: N/A  . Years of education: N/A   Occupational History  . Not on file.   Social History Main Topics  . Smoking status: Former Smoker    Packs/day: 1.00    Types: Cigarettes    Quit date: 10/15/2015  . Smokeless tobacco: Not on file  . Alcohol use 0.0 oz/week     Comment: social  . Drug use: Unknown    . Sexual activity: Not on file   Other Topics Concern  . Not on file   Social History Narrative   Married.   2 children   Works as an Scientist, water quality.   Enjoys spending time with her family.       Past Surgical History:  Procedure Laterality Date  . CHOLECYSTECTOMY  2001    Family History  Problem Relation Age of Onset  . Arthritis Mother   . Hypertension Mother   . Hypertension Father   . Diabetes Father   . Mental illness Maternal Grandfather     Allergies  Allergen Reactions  . Cephalosporins Hives  . Penicillins Hives    Current Outpatient Prescriptions on File Prior to Visit  Medication Sig Dispense Refill  . Blood Glucose Monitoring Suppl (ONE TOUCH ULTRA MINI) w/Device KIT Use as instructed to test blood sugar 2 times daily 1 each 0  . citalopram (CELEXA) 40 MG tablet TAKE ONE TABLET BY MOUTH ONCE DAILY 90 tablet 2  . glucose blood (ONE TOUCH ULTRA TEST) test strip Use as instructed to test blood sugar 2 times daily 100 each 11  . Lancet Devices (ONE TOUCH DELICA LANCING DEV) MISC Use as instructed to check blood sugar 2 times daily 100 each 11  . metoprolol succinate (TOPROL-XL)  25 MG 24 hr tablet TAKE ONE TABLET BY MOUTH ONCE DAILY 90 tablet 1  . ONETOUCH DELICA LANCETS FINE MISC Check blood sugar twice a day and as instructed. Dx E11.9 100 each 11   No current facility-administered medications on file prior to visit.     BP (!) 146/96   Pulse 75   Temp 98.4 F (36.9 C) (Oral)   Ht '5\' 6"'$  (1.676 m)   Wt 229 lb (103.9 kg)   SpO2 98%   BMI 36.96 kg/m    Objective:   Physical Exam  Constitutional: She appears well-nourished.  Cardiovascular: Normal rate and regular rhythm.   Pulmonary/Chest: Effort normal and breath sounds normal.  Skin: Skin is warm and dry.  Psychiatric:  Well dressed, appears tired, tearful during exam, anxious.          Assessment & Plan:

## 2017-02-23 ENCOUNTER — Encounter: Payer: Self-pay | Admitting: Primary Care

## 2017-02-23 DIAGNOSIS — F329 Major depressive disorder, single episode, unspecified: Secondary | ICD-10-CM

## 2017-02-23 DIAGNOSIS — F32A Depression, unspecified: Secondary | ICD-10-CM

## 2017-02-23 DIAGNOSIS — F419 Anxiety disorder, unspecified: Principal | ICD-10-CM

## 2017-02-23 MED ORDER — FLUOXETINE HCL 20 MG PO TABS: ORAL_TABLET | ORAL | 0 refills | Status: DC

## 2017-02-23 NOTE — Telephone Encounter (Signed)
Please ensure patient has read her my chart message and schedule patient for 1 month follow up of anxiety and depression.

## 2017-02-24 NOTE — Telephone Encounter (Signed)
Per DPR, left detail message of Kate's comments for patient to call back. 

## 2017-02-24 NOTE — Telephone Encounter (Signed)
Please ensure we get her scheduled for follow up in 1 month.

## 2017-03-24 ENCOUNTER — Ambulatory Visit (INDEPENDENT_AMBULATORY_CARE_PROVIDER_SITE_OTHER): Payer: Managed Care, Other (non HMO) | Admitting: Primary Care

## 2017-03-24 ENCOUNTER — Encounter: Payer: Self-pay | Admitting: Primary Care

## 2017-03-24 VITALS — BP 122/72 | HR 71 | Temp 98.3°F | Ht 66.0 in | Wt 219.5 lb

## 2017-03-24 DIAGNOSIS — F419 Anxiety disorder, unspecified: Secondary | ICD-10-CM

## 2017-03-24 DIAGNOSIS — E119 Type 2 diabetes mellitus without complications: Secondary | ICD-10-CM

## 2017-03-24 DIAGNOSIS — R51 Headache: Secondary | ICD-10-CM | POA: Diagnosis not present

## 2017-03-24 DIAGNOSIS — G43101 Migraine with aura, not intractable, with status migrainosus: Secondary | ICD-10-CM | POA: Diagnosis not present

## 2017-03-24 DIAGNOSIS — F329 Major depressive disorder, single episode, unspecified: Secondary | ICD-10-CM

## 2017-03-24 DIAGNOSIS — R519 Headache, unspecified: Secondary | ICD-10-CM

## 2017-03-24 LAB — HEMOGLOBIN A1C: HEMOGLOBIN A1C: 6.4 % (ref 4.6–6.5)

## 2017-03-24 MED ORDER — TOPIRAMATE 50 MG PO TABS: 50.0000 mg | ORAL_TABLET | Freq: Every day | ORAL | 0 refills | Status: DC

## 2017-03-24 MED ORDER — FLUOXETINE HCL 20 MG PO TABS: 20.0000 mg | ORAL_TABLET | Freq: Every day | ORAL | 1 refills | Status: DC

## 2017-03-24 NOTE — Assessment & Plan Note (Signed)
Overall improved on Prozac and BuSpar. She will use BuSpar as needed for breakthrough anxiety. Discouraged use of alprazolam given side effects of grogginess. Continue current regimen, she will update if symptoms progress.

## 2017-03-24 NOTE — Progress Notes (Signed)
Pre visit review using our clinic review tool, if applicable. No additional management support is needed unless otherwise documented below in the visit note. 

## 2017-03-24 NOTE — Progress Notes (Signed)
Subjective:    Patient ID: Vickie Waters, female    DOB: 31-Dec-1965, 51 y.o.   MRN: [id number]  HPI  Vickie Waters is a 51 year old female who presents today for follow up of anxiety and depression. She was last evaluated in mid March 2018 with PHQ 9 score of 15 and GAD 7 score of 15. During that visit she was recommended to continue Citalopram, start Buspar 7.5 mg BID, and was provided with a short term supply of alprazolam for breakthrough anxiety/panic attacks. We did switch her from Citalopram to the Prozac in early April 2018 as she didn't feel as though the citalopram was helping. Historically she did well on Prozac.  Since her last visit she's used the alprazolam sparingly, she has several tablets remaining. Overall she's feeling better on the Prozac. She finally has a diagnosis for her husbands illnesses which has provided a reduction in stress. She is taking the Buspar as needed for breakthrough anxiety/panic attacks. She still notices intermittent palpitations with anxiety. She is compliant to her Toprol.  2) Frequent Headaches: She has noticed increased frequency of headaches. She is currently managed on Topamax 25 mg at bedtime for headache prevention. Her headaches are located to the right temporal and parietal region of her cranium. She describes her pain as "ice picking" pain.  Review of Systems  Respiratory: Negative for shortness of breath.   Cardiovascular: Positive for palpitations. Negative for chest pain.  Neurological: Positive for headaches.  Psychiatric/Behavioral: Negative for sleep disturbance and suicidal ideas.       Anxiety has improved       Past Medical History:  Diagnosis Date  . Allergy   . Asthma   . Depression   . Frequent headaches   . Migraine      Social History   Social History  . Marital status: Married    Spouse name: N/A  . Number of children: N/A  . Years of education: N/A   Occupational History  . Not on file.   Social History Main  Topics  . Smoking status: Current Every Day Smoker    Packs/day: 1.50    Types: Cigarettes    Last attempt to quit: 10/15/2015  . Smokeless tobacco: Never Used  . Alcohol use 0.0 oz/week     Comment: social  . Drug use: Unknown  . Sexual activity: Not on file   Other Topics Concern  . Not on file   Social History Narrative   Married.   2 children   Works as an Scientist, water quality.   Enjoys spending time with her family.       Past Surgical History:  Procedure Laterality Date  . CHOLECYSTECTOMY  2001    Family History  Problem Relation Age of Onset  . Arthritis Mother   . Hypertension Mother   . Hypertension Father   . Diabetes Father   . Mental illness Maternal Grandfather     Allergies  Allergen Reactions  . Cephalosporins Hives  . Penicillins Hives    Current Outpatient Prescriptions on File Prior to Visit  Medication Sig Dispense Refill  . ALPRAZolam (XANAX) 0.25 MG tablet Take 1 tablet by mouth one to two times daily as needed for panic attacks. 20 tablet 0  . Blood Glucose Monitoring Suppl (ONE TOUCH ULTRA MINI) w/Device KIT Use as instructed to test blood sugar 2 times daily 1 each 0  . busPIRone (BUSPAR) 7.5 MG tablet Take 1 tablet (7.5 mg total) by mouth 2 (  two) times daily. 60 tablet 1  . glucose blood (ONE TOUCH ULTRA TEST) test strip Use as instructed to test blood sugar 2 times daily 100 each 11  . Lancet Devices (ONE TOUCH DELICA LANCING DEV) MISC Use as instructed to check blood sugar 2 times daily 100 each 11  . metoprolol succinate (TOPROL-XL) 25 MG 24 hr tablet TAKE ONE TABLET BY MOUTH ONCE DAILY 90 tablet 1  . ONETOUCH DELICA LANCETS FINE MISC Check blood sugar twice a day and as instructed. Dx E11.9 100 each 11   No current facility-administered medications on file prior to visit.     BP 122/72   Pulse 71   Temp 98.3 F (36.8 C) (Oral)   Ht _0  (1.676 m)   Wt 219 lb 8 oz (99.6 kg)   SpO2 97%   BMI 35.43 kg/m    Objective:    Physical Exam  Constitutional: She appears well-nourished.  Neck: Neck supple.  Cardiovascular: Normal rate, regular rhythm and normal heart sounds.   Pulmonary/Chest: Effort normal and breath sounds normal.  Skin: Skin is warm and dry.  Psychiatric: She has a normal mood and affect.  Mood much improved since last visit          Assessment & Plan:

## 2017-03-24 NOTE — Patient Instructions (Signed)
We've increased the dose of your Topamax to 50 mg at bedtime. You may take two of the 25 mg tablets until your bottle is empty.  I sent refills of Prozac to the pharmacy.  Use the Buspar as needed for anxiety/panic attacks.  It was a pleasure to see you today!

## 2017-03-24 NOTE — Assessment & Plan Note (Signed)
Increased frequency of headaches over the last several months. Increase Topamax to 50 mg at bedtime. She will update if no improvement.

## 2017-03-24 NOTE — Assessment & Plan Note (Signed)
Recheck A1c today 

## 2017-03-25 ENCOUNTER — Encounter: Payer: Self-pay | Admitting: Primary Care

## 2017-03-28 ENCOUNTER — Other Ambulatory Visit: Payer: Self-pay | Admitting: Primary Care

## 2017-03-28 DIAGNOSIS — F329 Major depressive disorder, single episode, unspecified: Secondary | ICD-10-CM

## 2017-03-28 DIAGNOSIS — F419 Anxiety disorder, unspecified: Principal | ICD-10-CM

## 2017-03-28 DIAGNOSIS — F32A Depression, unspecified: Secondary | ICD-10-CM

## 2017-05-04 ENCOUNTER — Other Ambulatory Visit: Payer: Self-pay | Admitting: Primary Care

## 2017-05-04 DIAGNOSIS — I10 Essential (primary) hypertension: Secondary | ICD-10-CM

## 2017-05-10 DIAGNOSIS — Z79899 Other long term (current) drug therapy: Secondary | ICD-10-CM | POA: Insufficient documentation

## 2017-05-10 DIAGNOSIS — J45909 Unspecified asthma, uncomplicated: Secondary | ICD-10-CM | POA: Diagnosis not present

## 2017-05-10 DIAGNOSIS — R202 Paresthesia of skin: Secondary | ICD-10-CM | POA: Diagnosis not present

## 2017-05-10 DIAGNOSIS — F17210 Nicotine dependence, cigarettes, uncomplicated: Secondary | ICD-10-CM | POA: Diagnosis not present

## 2017-05-10 DIAGNOSIS — I10 Essential (primary) hypertension: Secondary | ICD-10-CM | POA: Diagnosis not present

## 2017-05-10 DIAGNOSIS — R0789 Other chest pain: Secondary | ICD-10-CM | POA: Diagnosis not present

## 2017-05-10 DIAGNOSIS — E119 Type 2 diabetes mellitus without complications: Secondary | ICD-10-CM | POA: Diagnosis not present

## 2017-05-10 DIAGNOSIS — F4329 Adjustment disorder with other symptoms: Secondary | ICD-10-CM | POA: Insufficient documentation

## 2017-05-10 LAB — CBC
HEMATOCRIT: 43.2 % (ref 35.0–47.0)
HEMOGLOBIN: 14.8 g/dL (ref 12.0–16.0)
MCH: 30.7 pg (ref 26.0–34.0)
MCHC: 34.3 g/dL (ref 32.0–36.0)
MCV: 89.5 fL (ref 80.0–100.0)
Platelets: 234 10*3/uL (ref 150–440)
RBC: 4.82 MIL/uL (ref 3.80–5.20)
RDW: 13.7 % (ref 11.5–14.5)
WBC: 10.3 10*3/uL (ref 3.6–11.0)

## 2017-05-10 LAB — COMPREHENSIVE METABOLIC PANEL
ALT: 15 U/L (ref 14–54)
AST: 19 U/L (ref 15–41)
Albumin: 4.3 g/dL (ref 3.5–5.0)
Alkaline Phosphatase: 102 U/L (ref 38–126)
Anion gap: 9 (ref 5–15)
BUN: 17 mg/dL (ref 6–20)
CHLORIDE: 106 mmol/L (ref 101–111)
CO2: 23 mmol/L (ref 22–32)
CREATININE: 1.05 mg/dL — AB (ref 0.44–1.00)
Calcium: 9.3 mg/dL (ref 8.9–10.3)
GFR calc Af Amer: >60 mL/min (ref >60)
Glucose, Bld: 100 mg/dL — ABNORMAL HIGH (ref 65–99)
Potassium: 3.9 mmol/L (ref 3.5–5.1)
SODIUM: 138 mmol/L (ref 135–145)
Total Bilirubin: 0.6 mg/dL (ref 0.3–1.2)
Total Protein: 8 g/dL (ref 6.5–8.1)

## 2017-05-10 LAB — TROPONIN I

## 2017-05-10 NOTE — ED Triage Notes (Signed)
Pt reports that she has been under extreme stress with her husband's health over the past 3 months, ptis tearful states that she has been having bouts of chest pain and pounding, reports intermittent numbness to the left side of her face and teeth pain and occasional pain in the left arm with intermittent numbness, pt reports recent changes in her medications to help with her stress level. Pt states that she is reaching a boiling point at times with the amount of stress she is under and is unsure if her symptoms are related to that or if she is having mini strokes or a problem with her heart

## 2017-05-11 ENCOUNTER — Emergency Department
Admission: EM | Admit: 2017-05-11 | Discharge: 2017-05-11 | Disposition: A | Discharge status: Home / Self Care | Payer: Managed Care, Other (non HMO) | Attending: Emergency Medicine | Admitting: Emergency Medicine

## 2017-05-11 ENCOUNTER — Encounter: Payer: Self-pay | Admitting: Primary Care

## 2017-05-11 ENCOUNTER — Emergency Department: Payer: Managed Care, Other (non HMO)

## 2017-05-11 DIAGNOSIS — R202 Paresthesia of skin: Secondary | ICD-10-CM

## 2017-05-11 DIAGNOSIS — F419 Anxiety disorder, unspecified: Principal | ICD-10-CM

## 2017-05-11 DIAGNOSIS — F4329 Adjustment disorder with other symptoms: Secondary | ICD-10-CM

## 2017-05-11 DIAGNOSIS — F329 Major depressive disorder, single episode, unspecified: Secondary | ICD-10-CM

## 2017-05-11 LAB — TSH: TSH: 3.34 u[IU]/mL (ref 0.350–4.500)

## 2017-05-11 MED ORDER — ALPRAZOLAM 0.25 MG PO TABS: 0.2500 mg | ORAL_TABLET | Freq: Three times a day (TID) | ORAL | 0 refills | Status: DC | PRN

## 2017-05-11 MED ORDER — FLUOXETINE HCL 20 MG PO TABS: 20.0000 mg | ORAL_TABLET | Freq: Two times a day (BID) | ORAL | 1 refills | Status: DC

## 2017-05-11 NOTE — ED Notes (Signed)
Pt to CT

## 2017-05-11 NOTE — ED Provider Notes (Signed)
[facility] Emergency Department Provider Note  ____________________________________________   First MD Initiated Contact with Patient 05/11/17 0124     (approximate)  I have reviewed the triage vital signs and the nursing notes.   HISTORY  Chief Complaint Numbness  HPI Vickie Waters is a 51 y.o. female here for evaluation of episodes where she feels numb and tingly.  Patient reports for about the last 2 years she has been experiencing times or she'll feel a tingling sensation in her arms or legs, often times over the left side, but occasionally on the right. Seems to come about during times of heavy stress. Her husband had a sudden cardiac arrest 2 years ago, she has since then been the primary breadwinner for the family and this is causing him and stress for her and also ongoing concerns for his medical care.  She reports she saw a physician at [facility], was placed on antidepressants and using Xanax occasionally and this was making some improvement but she continues to cry frequently during the day.  Patient also experiences occasional discomfort and tightening in her chest that comes and goes, last reported had some this evening which is now gone away. This also occurs on a regular basis, usually around times of stress. Denies any radiating pain. No nausea or vomiting. No abdominal pain.  No history of heart disease.  Past Medical History:  Diagnosis Date  . Allergy   . Asthma   . Depression   . Frequent headaches   . Migraine     Patient Active Problem List   Diagnosis Date Noted  . Murmur, cardiac 03/04/2016  . Encounter for preventative adult health care exam with abnormal findings 03/04/2016  . Type 2 diabetes mellitus ([place]) 03/04/2016  . Vitamin D deficiency 03/04/2016  . Migraine with aura and with status migrainosus, not intractable 11/15/2015  . Essential hypertension 08/23/2015  . Anxiety and depression 07/12/2015  . Tobacco abuse  07/12/2015    Past Surgical History:  Procedure Laterality Date  . CHOLECYSTECTOMY  2001    Prior to Admission medications   Medication Sig Start Date End Date Taking? Authorizing Provider  ALPRAZolam (XANAX) 0.25 MG tablet Take 1 tablet (0.25 mg total) by mouth 3 (three) times daily as needed for anxiety. 05/11/17 05/11/18  Delman Kitten, MD  Blood Glucose Monitoring Suppl (ONE TOUCH ULTRA MINI) w/Device KIT Use as instructed to test blood sugar 2 times daily 06/06/16   Pleas Koch, NP  busPIRone (BUSPAR) 7.5 MG tablet Take 1 tablet (7.5 mg total) by mouth 2 (two) times daily. 01/28/17   Pleas Koch, NP  FLUoxetine (PROZAC) 20 MG tablet Take 1 tablet (20 mg total) by mouth daily. 03/24/17   Pleas Koch, NP  glucose blood (ONE TOUCH ULTRA TEST) test strip Use as instructed to test blood sugar 2 times daily 06/06/16   Pleas Koch, NP  Lancet Devices (ONE TOUCH DELICA LANCING DEV) MISC Use as instructed to check blood sugar 2 times daily 06/16/16   Pleas Koch, NP  metoprolol succinate (TOPROL-XL) 25 MG 24 hr tablet TAKE ONE TABLET BY MOUTH ONCE DAILY 05/04/17   Pleas Koch, NP  [facility] DELICA LANCETS FINE MISC Check blood sugar twice a day and as instructed. Dx E11.9 06/17/16   Pleas Koch, NP  topiramate (TOPAMAX) 50 MG tablet Take 1 tablet (50 mg total) by mouth at bedtime. 03/24/17   Pleas Koch, NP    Allergies  Cephalosporins and Penicillins  Family History  Problem Relation Age of Onset  . Arthritis Mother   . Hypertension Mother   . Hypertension Father   . Diabetes Father   . Mental illness Maternal Grandfather     Social History Social History  Substance Use Topics  . Smoking status: Current Every Day Smoker    Packs/day: 1.50    Types: Cigarettes    Last attempt to quit: 10/15/2015  . Smokeless tobacco: Never Used  . Alcohol use 0.0 oz/week     Comment: social    Review of Systems Constitutional: No fever/chills Eyes: No  visual changes. ENT: No sore throat. Cardiovascular: See history of present illness Respiratory: Denies shortness of breath. Gastrointestinal: No abdominal pain.  No nausea, no vomiting.  No diarrhea.  No constipation. Genitourinary: Negative for dysuria. Musculoskeletal: Negative for back pain. Skin: Negative for rash. Neurological: Negative for headaches, focal weakness, no trouble speaking. See history of present illness    ____________________________________________   PHYSICAL EXAM:  VITAL SIGNS: ED Triage Vitals  Enc Vitals Group     BP 05/10/17 1936 (!) 201/118     Pulse Rate 05/10/17 1936 79     Resp 05/10/17 1936 18     Temp 05/10/17 1936 98.3 F (36.8 C)     Temp Source 05/10/17 1936 Oral     SpO2 05/10/17 1936 98 %     Weight 05/10/17 1949 213 lb (96.6 kg)     Height 05/10/17 1949 '5\' 4"'$  (1.626 m)     Head Circumference --      Peak Flow --      Pain Score --      Pain Loc --      Pain Edu? --      Excl. in [place]? --     Constitutional: Alert and oriented. Well appearing and in no acute distress.Cries occasionally and she begins to speak about the stresses she encounters during life. Eyes: Conjunctivae are normal. Head: Atraumatic. Nose: No congestion/rhinnorhea. Mouth/Throat: Mucous membranes are moist. Neck: No stridor.   Cardiovascular: Normal rate, regular rhythm. Grossly normal heart sounds.  Good peripheral circulation. Respiratory: Normal respiratory effort.  No retractions. Lungs CTAB. Gastrointestinal: Soft and nontender. No distention. Musculoskeletal: No lower extremity tenderness nor edema. Neurologic:  Normal speech and language. No gross focal neurologic deficits are appreciated.  Skin:  Skin is warm, dry and intact. No rash noted. Psychiatric: Mood and affect are normal. Speech and behavior are normal. 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 She has seen a counselor once, and also  is under care by [facility].  ____________________________________________   LABS (all labs ordered are listed, but only abnormal results are displayed)  Labs Reviewed  COMPREHENSIVE METABOLIC PANEL - Abnormal; Notable for the following:       Result Value   Glucose, Bld 100 (*)    Creatinine, Ser 1.05 (*)    All other components within normal limits  CBC  TROPONIN I  TSH   ____________________________________________  EKG  ED ECG REPORT I, Lynnsey Barbara, the attending physician, personally viewed and interpreted this ECG.  Date: 05/11/2017 EKG Time: 1955 Rate: 65 Rhythm: normal sinus rhythm QRS Axis: normal Intervals: normal ST/T Wave abnormalities: normal Narrative Interpretation: unremarkable  ____________________________________________  RADIOLOGY  Ct Head Wo Contrast  Result Date: 05/11/2017 CLINICAL DATA:  Intermittent headaches. Stress. History of migraines, diabetes, hypertension. EXAM: CT HEAD WITHOUT CONTRAST  TECHNIQUE: Contiguous axial images were obtained from the base of the skull through the vertex without intravenous contrast. COMPARISON:  None. FINDINGS: BRAIN: No intraparenchymal hemorrhage, mass effect nor midline shift. The ventricles and sulci are normal. No acute large vascular territory infarcts. No abnormal extra-axial fluid collections. Basal cisterns are patent. VASCULAR: Unremarkable. SKULL/SOFT TISSUES: No skull fracture. No significant soft tissue swelling. ORBITS/SINUSES: The included ocular globes and orbital contents are normal.The mastoid aircells and included paranasal sinuses are well-aerated. OTHER: None. IMPRESSION: Normal noncontrast CT HEAD. Electronically Signed   By: Elon Alas M.D.   On: 05/11/2017 02:23    ____________________________________________   PROCEDURES  Procedure(s) performed: None  Procedures  Critical Care performed: No  ____________________________________________   INITIAL IMPRESSION / ASSESSMENT  AND PLAN / ED COURSE  Pertinent labs & imaging results that were available during my care of the patient were reviewed by me and considered in my medical decision making (see chart for details).  Patient presents for several years of symptoms, this sounds very much like that of possibly debilitating stress and anxiety with some mixed depression. On my exam no evidence of acute condition, though her blood pressure was elevated, to speak coming down now after she has become calm. She denies any psychotic symptoms. She is under care of a primary doctor at [facility], and is on antidepressants. HEART score low risk MACE.  Her EKG is reassuring and normal. She is relatively low risk for coronary disease, but does smoke. In addition, she reports occasional tingling numbness that does not seem to have a unilateral distribution, sounds like it could be related to her anxiety though I'm not certain. She requested to have a CT of her head done, and I will proceed with this as I think it is reasonable to help exclude intra-cranial abnormality given her associated symptoms of tingling that comes and goes over the last few years.      ----------------------------------------- 2:44 AM on 05/11/2017 -----------------------------------------  Resting comfortably with no concern. She is calm and appropriate. Reviewed her test results, and she'll be following up closely with her primary. I also gave her follow-up information with cardiology for further workup given the intermittent chest discomfort she's been experiencing. Find low likelihood for acute coronary syndrome, did advise appropriately however with careful return precautions and follow-up plan  Discuss safe use of Xanax, patient agreeable not to drive while taking.  ____________________________________________   FINAL CLINICAL IMPRESSION(S) / ED DIAGNOSES  Final diagnoses:  Paresthesia  Stress and adjustment reaction      NEW MEDICATIONS  STARTED DURING THIS VISIT:  New Prescriptions   ALPRAZOLAM (XANAX) 0.25 MG TABLET    Take 1 tablet (0.25 mg total) by mouth 3 (three) times daily as needed for anxiety.     Note:  This document was prepared using Dragon voice recognition software and may include unintentional dictation errors.     Delman Kitten, MD 05/11/17 [phone]

## 2017-05-11 NOTE — Discharge Instructions (Signed)
As we have discussed today?s test results are normal, but you may require further testing.  Please follow up with the recommended doctor as instructed above in these documents regarding today?s emergent visit and your recent symptoms to discuss further management.  Continue to take your regular medications. If you are not doing so already, please also take a daily baby aspirin (81 mg), at least until you follow up with your doctor.  Return to the Emergency Department (ED) if you experience any further chest pain/pressure/tightness, difficulty breathing, or sudden sweating, or other symptoms that concern you.  Do not drive or be in a dangerous place when you take Xanax (it can make you drowsy).

## 2017-05-11 NOTE — Telephone Encounter (Signed)
Please schedule her for office visit follow up for anxiety and depression in 1 month.

## 2017-05-12 ENCOUNTER — Encounter: Payer: Self-pay | Admitting: Primary Care

## 2017-05-12 DIAGNOSIS — F32A Depression, unspecified: Secondary | ICD-10-CM

## 2017-05-12 DIAGNOSIS — F329 Major depressive disorder, single episode, unspecified: Secondary | ICD-10-CM

## 2017-05-12 DIAGNOSIS — F419 Anxiety disorder, unspecified: Principal | ICD-10-CM

## 2017-05-12 MED ORDER — BUSPIRONE HCL 7.5 MG PO TABS: ORAL_TABLET | ORAL | 1 refills | Status: DC

## 2017-05-12 NOTE — Telephone Encounter (Signed)
Spoken to patient and 06/10/2017

## 2017-05-18 ENCOUNTER — Other Ambulatory Visit: Payer: Self-pay | Admitting: Internal Medicine

## 2017-05-18 DIAGNOSIS — Z1231 Encounter for screening mammogram for malignant neoplasm of breast: Secondary | ICD-10-CM

## 2017-06-03 ENCOUNTER — Ambulatory Visit
Admission: RE | Admit: 2017-06-03 | Discharge: 2017-06-03 | Disposition: A | Discharge status: Home / Self Care | Payer: Managed Care, Other (non HMO) | Source: Ambulatory Visit | Attending: Internal Medicine | Admitting: Internal Medicine

## 2017-06-03 DIAGNOSIS — Z1231 Encounter for screening mammogram for malignant neoplasm of breast: Secondary | ICD-10-CM | POA: Diagnosis present

## 2017-06-10 ENCOUNTER — Encounter: Payer: Self-pay | Admitting: Primary Care

## 2017-06-10 ENCOUNTER — Ambulatory Visit (INDEPENDENT_AMBULATORY_CARE_PROVIDER_SITE_OTHER): Payer: Managed Care, Other (non HMO) | Admitting: Primary Care

## 2017-06-10 DIAGNOSIS — G43101 Migraine with aura, not intractable, with status migrainosus: Secondary | ICD-10-CM

## 2017-06-10 DIAGNOSIS — F419 Anxiety disorder, unspecified: Secondary | ICD-10-CM | POA: Diagnosis not present

## 2017-06-10 DIAGNOSIS — F329 Major depressive disorder, single episode, unspecified: Secondary | ICD-10-CM | POA: Diagnosis not present

## 2017-06-10 NOTE — Progress Notes (Signed)
Subjective:    Patient ID: Vickie Waters, female    DOB: June 16, 1966, 51 y.o.   MRN: [id number]  HPI  [name] is a 51 year old female with a history of anxiety and depression who presents today for follow up of anxiety and depression.   During her last visit she endorsed feeling improved on Prozac. She was using Buspar irregularly for breakthrough anxiety, and also using alprazolam sparingly. She did present to the emergency department on 06/25 with complaints of numbness/crying spells. Acute coronary syndrome was ruled out during her visit in the emergency department so she was discharged home. Given her symptoms of anxiety and depression we decided to increase her Prozac to 20 mg BID.   Since her Prozac dose increase one month ago she's decided to wean down on her medications. She started noticing tremors and stiffness to her jaw, feeling foggy, feeling headache.. She's taking Prozac 20 mg once daily, Buspar 7.5 mg once daily. Overall she's feeling "okay" on this regimen. She denies SI/HI. She's concerned about all of these symptoms and would like to wean off of Prozac and Topamax.  Review of Systems  Constitutional: Positive for fatigue.  Respiratory: Negative for shortness of breath.   Cardiovascular: Negative for chest pain.  Neurological: Positive for tremors. Negative for weakness.       Intermittent headaches, overall stable       Past Medical History:  Diagnosis Date  . Allergy   . Asthma   . Depression   . Frequent headaches   . Migraine      Social History   Social History  . Marital status: Married    Spouse name: N/A  . Number of children: N/A  . Years of education: N/A   Occupational History  . Not on file.   Social History Main Topics  . Smoking status: Current Every Day Smoker    Packs/day: 1.50    Types: Cigarettes    Last attempt to quit: 10/15/2015  . Smokeless tobacco: Never Used  . Alcohol use 0.0 oz/week     Comment: social  . Drug use:  Unknown  . Sexual activity: Not on file   Other Topics Concern  . Not on file   Social History Narrative   Married.   2 children   Works as an Scientist, water quality.   Enjoys spending time with her family.       Past Surgical History:  Procedure Laterality Date  . CHOLECYSTECTOMY  2001    Family History  Problem Relation Age of Onset  . Arthritis Mother   . Hypertension Mother   . Hypertension Father   . Diabetes Father   . Mental illness Maternal Grandfather   . Breast cancer Neg Hx     Allergies  Allergen Reactions  . Cephalosporins Hives  . Penicillins Hives    Current Outpatient Prescriptions on File Prior to Visit  Medication Sig Dispense Refill  . Blood Glucose Monitoring Suppl (ONE TOUCH ULTRA MINI) w/Device KIT Use as instructed to test blood sugar 2 times daily 1 each 0  . busPIRone (BUSPAR) 7.5 MG tablet Take 1 tablet by mouth twice daily for anxiety. (Patient taking differently: Take 7.5 mg by mouth daily. Take 1 tablet by mouth twice daily for anxiety.) 180 tablet 1  . glucose blood (ONE TOUCH ULTRA TEST) test strip Use as instructed to test blood sugar 2 times daily 100 each 11  . Lancet Devices (ONE TOUCH DELICA LANCING DEV) MISC Use  as instructed to check blood sugar 2 times daily 100 each 11  . metoprolol succinate (TOPROL-XL) 25 MG 24 hr tablet TAKE ONE TABLET BY MOUTH ONCE DAILY 90 tablet 1  . ONETOUCH DELICA LANCETS FINE MISC Check blood sugar twice a day and as instructed. Dx E11.9 100 each 11   No current facility-administered medications on file prior to visit.     BP 126/82   Pulse 76   Temp 98.4 F (36.9 C) (Oral)   Ht '5\' 4"'$  (1.626 m)   Wt 219 lb 1.9 oz (99.4 kg)   SpO2 97%   BMI 37.61 kg/m    Objective:   Physical Exam  Constitutional: She appears well-nourished.  Neck: Neck supple.  Cardiovascular: Normal rate and regular rhythm.   Pulmonary/Chest: Breath sounds normal.  Skin: Skin is warm and dry.  Psychiatric: She has a normal  mood and affect.  Mood improved this visit.          Assessment & Plan:

## 2017-06-10 NOTE — Patient Instructions (Signed)
Start taking 1/2 tablet of Prozac daily for 10 days then stop.  Start taking 1/2 tablet of Topamax daily then stop.  Increase Buspar to 7.5 mg twice daily, everyday.  It was a pleasure to see you today!

## 2017-06-10 NOTE — Assessment & Plan Note (Signed)
Increased side effects on Prozac 20 mg BID. Will wean off slowly, she is already down to the 20 mg dose. Will have her take Buspar BID, everyday. She will update.

## 2017-06-10 NOTE — Assessment & Plan Note (Signed)
Increased side effects on increased dose of Topamax, she would like to come off. Now taking 25 mg. Will wean her off over the next 1-2 weeks. Discussed to notify if headaches.

## 2017-06-18 ENCOUNTER — Encounter: Payer: Self-pay | Admitting: Primary Care

## 2017-06-18 ENCOUNTER — Ambulatory Visit (INDEPENDENT_AMBULATORY_CARE_PROVIDER_SITE_OTHER): Payer: Managed Care, Other (non HMO) | Admitting: Primary Care

## 2017-06-18 ENCOUNTER — Other Ambulatory Visit (HOSPITAL_COMMUNITY)
Admission: RE | Admit: 2017-06-18 | Discharge: 2017-06-18 | Disposition: A | Discharge status: Home / Self Care | Payer: Managed Care, Other (non HMO) | Source: Ambulatory Visit | Attending: Primary Care | Admitting: Primary Care

## 2017-06-18 VITALS — BP 126/84 | HR 60 | Temp 98.2°F | Ht 66.0 in | Wt 219.4 lb

## 2017-06-18 DIAGNOSIS — Z23 Encounter for immunization: Secondary | ICD-10-CM

## 2017-06-18 DIAGNOSIS — I10 Essential (primary) hypertension: Secondary | ICD-10-CM

## 2017-06-18 DIAGNOSIS — Z124 Encounter for screening for malignant neoplasm of cervix: Secondary | ICD-10-CM | POA: Diagnosis not present

## 2017-06-18 DIAGNOSIS — F329 Major depressive disorder, single episode, unspecified: Secondary | ICD-10-CM | POA: Diagnosis not present

## 2017-06-18 DIAGNOSIS — Z0001 Encounter for general adult medical examination with abnormal findings: Secondary | ICD-10-CM

## 2017-06-18 DIAGNOSIS — F32A Depression, unspecified: Secondary | ICD-10-CM

## 2017-06-18 DIAGNOSIS — E119 Type 2 diabetes mellitus without complications: Secondary | ICD-10-CM | POA: Diagnosis not present

## 2017-06-18 DIAGNOSIS — F419 Anxiety disorder, unspecified: Secondary | ICD-10-CM | POA: Insufficient documentation

## 2017-06-18 DIAGNOSIS — F17210 Nicotine dependence, cigarettes, uncomplicated: Secondary | ICD-10-CM | POA: Diagnosis not present

## 2017-06-18 MED ORDER — ZOSTER VAC RECOMB ADJUVANTED 50 MCG/0.5ML IM SUSR: 0.5000 mL | Freq: Once | INTRAMUSCULAR | 1 refills | Status: AC

## 2017-06-18 MED ORDER — ZOSTER VAC RECOMB ADJUVANTED 50 MCG/0.5ML IM SUSR: 0.5000 mL | Freq: Once | INTRAMUSCULAR | 1 refills | Status: DC

## 2017-06-18 NOTE — Patient Instructions (Signed)
It's important to improve your diet by reducing consumption of fast food, fried food, processed snack foods, sugary drinks. Increase consumption of fresh vegetables and fruits, whole grains, water.  Ensure you are drinking 64 ounces of water daily.  Start exercising. You should be getting 150 minutes of moderate intensity exercise weekly.  We will notify you of your Pap results once received.   Schedule a lab only appointment in 3 months.  It was a pleasure to see you today!  Diabetes Mellitus and Food It is important for you to manage your blood sugar (glucose) level. Your blood glucose level can be greatly affected by what you eat. Eating healthier foods in the appropriate amounts throughout the day at about the same time each day will help you control your blood glucose level. It can also help slow or prevent worsening of your diabetes mellitus. Healthy eating may even help you improve the level of your blood pressure and reach or maintain a healthy weight. General recommendations for healthful eating and cooking habits include:  Eating meals and snacks regularly. Avoid going long periods of time without eating to lose weight.  Eating a diet that consists mainly of plant-based foods, such as fruits, vegetables, nuts, legumes, and whole grains.  Using low-heat cooking methods, such as baking, instead of high-heat cooking methods, such as deep frying.  Work with your dietitian to make sure you understand how to use the Nutrition Facts information on food labels. How can food affect me? Carbohydrates Carbohydrates affect your blood glucose level more than any other type of food. Your dietitian will help you determine how many carbohydrates to eat at each meal and teach you how to count carbohydrates. Counting carbohydrates is important to keep your blood glucose at a healthy level, especially if you are using insulin or taking certain medicines for diabetes mellitus. Alcohol Alcohol can  cause sudden decreases in blood glucose (hypoglycemia), especially if you use insulin or take certain medicines for diabetes mellitus. Hypoglycemia can be a life-threatening condition. Symptoms of hypoglycemia (sleepiness, dizziness, and disorientation) are similar to symptoms of having too much alcohol. If your health care provider has given you approval to drink alcohol, do so in moderation and use the following guidelines:  Women should not have more than one drink per day, and men should not have more than two drinks per day. One drink is equal to: ? 12 oz of beer. ? 5 oz of wine. ? 1 oz of hard liquor.  Do not drink on an empty stomach.  Keep yourself hydrated. Have water, diet soda, or unsweetened iced tea.  Regular soda, juice, and other mixers might contain a lot of carbohydrates and should be counted.  What foods are not recommended? As you make food choices, it is important to remember that all foods are not the same. Some foods have fewer nutrients per serving than other foods, even though they might have the same number of calories or carbohydrates. It is difficult to get your body what it needs when you eat foods with fewer nutrients. Examples of foods that you should avoid that are high in calories and carbohydrates but low in nutrients include:  Trans fats (most processed foods list trans fats on the Nutrition Facts label).  Regular soda.  Juice.  Candy.  Sweets, such as cake, pie, doughnuts, and cookies.  Fried foods.  What foods can I eat? Eat nutrient-rich foods, which will nourish your body and keep you healthy. The food you should eat  also will depend on several factors, including:  The calories you need.  The medicines you take.  Your weight.  Your blood glucose level.  Your blood pressure level.  Your cholesterol level.  You should eat a variety of foods, including:  Protein. ? Lean cuts of meat. ? Proteins low in saturated fats, such as fish,  egg whites, and beans. Avoid processed meats.  Fruits and vegetables. ? Fruits and vegetables that may help control blood glucose levels, such as apples, mangoes, and yams.  Dairy products. ? Choose fat-free or low-fat dairy products, such as milk, yogurt, and cheese.  Grains, bread, pasta, and rice. ? Choose whole grain products, such as multigrain bread, whole oats, and brown rice. These foods may help control blood pressure.  Fats. ? Foods containing healthful fats, such as nuts, avocado, olive oil, canola oil, and fish.  Does everyone with diabetes mellitus have the same meal plan? Because every person with diabetes mellitus is different, there is not one meal plan that works for everyone. It is very important that you meet with a dietitian who will help you create a meal plan that is just right for you. This information is not intended to replace advice given to you by your health care provider. Make sure you discuss any questions you have with your health care provider. Document Released: 07/31/2005 Document Revised: 04/10/2016 Document Reviewed: 09/30/2013 Elsevier Interactive Patient Education  2017 [employer].

## 2017-06-18 NOTE — Addendum Note (Signed)
Addended by: Jacqualin Combes on: 06/18/2017 08:46 AM   Modules accepted: Orders

## 2017-06-18 NOTE — Assessment & Plan Note (Signed)
A1C in May 2018 of 6.4 which is an improvement. Will have her continue to work on diet, start exercising, repeat A1C in 3 months. Also complete urine microalbumin in 3 months, not managed on ACE/ARB. Not managed on statin, repeat lipids in 3 months.   Pneumovax provided today.

## 2017-06-18 NOTE — Progress Notes (Addendum)
Subjective:    Patient ID: Vickie Waters, female    DOB: [birth year], 51 y.o.   MRN: [id number]  HPI  Vickie Waters is a 51 year old female who presents today for complete physical.  Immunizations: -Tetanus: Completed in 2009 -Influenza: Did not complete last season -Pneumonia: Never completed  -Shingles: Interested in Shingrix   Diet: She endorses an improved diet. Recently a lot of cakes. Breakfast: [name], eggs, bagel  Lunch: Fast food, food trucks Dinner: Veggies, meat, some pasta Snacks: Nut bars, nuts, peanut butter crackers Desserts: Cupcakes, cakes lately Beverages: Diet pepsi, little water, sweet tea  Exercise: She is not currently exercising. Eye exam: November 2017 Dental exam: Completes semi-annually  Colonoscopy: Never completed. Would like to defer given stress with husband. Did not schedule last year as ordered.  Pap Smear: Completed in 2017, negative Mammogram: Completed in July 2018   Review of Systems  Constitutional: Negative for unexpected weight change.  HENT: Negative for rhinorrhea.   Respiratory: Negative for cough and shortness of breath.   Cardiovascular: Negative for chest pain.  Gastrointestinal: Negative for constipation and diarrhea.  Genitourinary: Negative for difficulty urinating and menstrual problem.  Musculoskeletal: Negative for arthralgias and myalgias.  Skin: Negative for rash.  Allergic/Immunologic: Negative for environmental allergies.  Neurological: Negative for dizziness, numbness and headaches.  Psychiatric/Behavioral:       Overall improved, weaning off of buspirone and fluoxetine       Past Medical History:  Diagnosis Date  . Allergy   . Asthma   . Depression   . Frequent headaches   . Migraine      Social History   Social History  . Marital status: Married    Spouse name: N/A  . Number of children: N/A  . Years of education: N/A   Occupational History  . Not on file.   Social History Main Topics  . Smoking  status: Current Every Day Smoker    Packs/day: 1.50    Types: Cigarettes    Last attempt to quit: 10/15/2015  . Smokeless tobacco: Never Used  . Alcohol use 0.0 oz/week     Comment: social  . Drug use: Unknown  . Sexual activity: Not on file   Other Topics Concern  . Not on file   Social History Narrative   Married.   2 children   Works as an Scientist, water quality.   Enjoys spending time with her family.       Past Surgical History:  Procedure Laterality Date  . CHOLECYSTECTOMY  2001    Family History  Problem Relation Age of Onset  . Arthritis Mother   . Hypertension Mother   . Hypertension Father   . Diabetes Father   . Mental illness Maternal Grandfather   . Breast cancer Neg Hx     Allergies  Allergen Reactions  . Cephalosporins Hives  . Penicillins Hives    Current Outpatient Prescriptions on File Prior to Visit  Medication Sig Dispense Refill  . Blood Glucose Monitoring Suppl (ONE TOUCH ULTRA MINI) w/Device KIT Use as instructed to test blood sugar 2 times daily 1 each 0  . busPIRone (BUSPAR) 7.5 MG tablet Take 1 tablet by mouth twice daily for anxiety. (Patient taking differently: Take 7.5 mg by mouth daily. Take 1 tablet by mouth twice daily for anxiety.) 180 tablet 1  . glucose blood (ONE TOUCH ULTRA TEST) test strip Use as instructed to test blood sugar 2 times daily 100 each 11  . Lancet  Devices (ONE TOUCH DELICA LANCING DEV) MISC Use as instructed to check blood sugar 2 times daily 100 each 11  . metoprolol succinate (TOPROL-XL) 25 MG 24 hr tablet TAKE ONE TABLET BY MOUTH ONCE DAILY 90 tablet 1  . ONETOUCH DELICA LANCETS FINE MISC Check blood sugar twice a day and as instructed. Dx E11.9 100 each 11   No current facility-administered medications on file prior to visit.     BP 126/84   Pulse 60   Temp 98.2 F (36.8 C) (Oral)   Ht '5\' 6"'$  (1.676 m)   Wt 219 lb 6.4 oz (99.5 kg)   SpO2 98%   BMI 35.41 kg/m    Objective:   Physical Exam    Constitutional: She is oriented to person, place, and time. She appears well-nourished.  HENT:  Right Ear: Tympanic membrane and ear canal normal.  Left Ear: Tympanic membrane and ear canal normal.  Nose: Nose normal.  Mouth/Throat: Oropharynx is clear and moist.  Eyes: Pupils are equal, round, and reactive to light. Conjunctivae and EOM are normal.  Neck: Neck supple. No thyromegaly present.  Cardiovascular: Normal rate and regular rhythm.   No murmur heard. Pulmonary/Chest: Effort normal and breath sounds normal. She has no rales.  Abdominal: Soft. Bowel sounds are normal. There is no tenderness.  Genitourinary: There is no tenderness or lesion on the right labia. There is no tenderness or lesion on the left labia. Cervix exhibits no motion tenderness and no discharge. Right adnexum displays no tenderness. Left adnexum displays no tenderness. No vaginal discharge found.  Musculoskeletal: Normal range of motion.  Lymphadenopathy:    She has no cervical adenopathy.  Neurological: She is alert and oriented to person, place, and time. She has normal reflexes. No cranial nerve deficit.  Skin: Skin is warm and dry. No rash noted.  Psychiatric: She has a normal mood and affect.          Assessment & Plan:

## 2017-06-18 NOTE — Assessment & Plan Note (Signed)
Weaning off meds, doing well thus far.

## 2017-06-18 NOTE — Assessment & Plan Note (Signed)
Stable today continue current regimen.

## 2017-06-18 NOTE — Assessment & Plan Note (Signed)
Td due in 2019. Pneumovax due, provided today. Rx for Shingrix vaccination provided. Pap UTD, but wanted to repeat today, pending. Mammogram UTD. Declines colonoscopy. She will check in to [facility]. Discussed the importance of a healthy diet and regular exercise in order for weight loss, and to reduce the risk of other medical problems. Exam unremarkable. Labs pending, will return in 3 months. Follow up in 1 year.

## 2017-06-19 LAB — CYTOLOGY - PAP
ADEQUACY: ABSENT
Diagnosis: NEGATIVE
HPV: NOT DETECTED

## 2017-08-04 ENCOUNTER — Encounter: Payer: Self-pay | Admitting: Primary Care

## 2017-08-04 ENCOUNTER — Ambulatory Visit (INDEPENDENT_AMBULATORY_CARE_PROVIDER_SITE_OTHER): Payer: Managed Care, Other (non HMO) | Admitting: Primary Care

## 2017-08-04 VITALS — BP 132/80 | HR 77 | Temp 98.8°F | Ht 66.0 in | Wt 224.4 lb

## 2017-08-04 DIAGNOSIS — M25511 Pain in right shoulder: Secondary | ICD-10-CM | POA: Diagnosis not present

## 2017-08-04 MED ORDER — METHOCARBAMOL 500 MG PO TABS: 500.0000 mg | ORAL_TABLET | Freq: Three times a day (TID) | ORAL | 0 refills | Status: DC | PRN

## 2017-08-04 MED ORDER — NAPROXEN 500 MG PO TABS: 500.0000 mg | ORAL_TABLET | Freq: Two times a day (BID) | ORAL | 0 refills | Status: DC

## 2017-08-04 NOTE — Patient Instructions (Signed)
Start methocarbamol 500 mg tablets (muscle relaxer). Take 1 tablet by mouth every 8 hours as needed for muscle tightness/spasms. Caution as this may cause drowsiness.  Start naproxen 500 mg tablets for pain. Take 1 tablet by mouth twice daily as needed for pain. Take with food.  Please call me Friday this week if no improvement in symptoms.  It was a pleasure to see you today!

## 2017-08-04 NOTE — Progress Notes (Signed)
Subjective:    Patient ID: Vickie Waters, female    DOB: 1966/03/08, 51 y.o.   MRN: [id number]  HPI  Ms. Capri is a 51 year old female who presents today with a chief complaint of shoulder pain.   Her pain is located to the right posterior and top shoulder with radiation to the right lateral neck and down through her upper extremity. The majority of her pain is to the right anterior biceps region. She describes her pain as constantly dull with intermittent throbbing that is worse with range of motion movements. Her symptoms began 7 days ago.   She has been exerting herself recently with painting, heavy lifting, and typing on her laptop at a raised/akward position. She's been taking Extra Strength Tylenol, Aleve, several OTC topical creams without improvement. She can't stretch her arm out when laying in bed so sleeping at night has been uncomfortable. She denies weakness, trauma, numbness/tingling.   Review of Systems  Musculoskeletal: Positive for arthralgias.  Neurological: Negative for weakness and numbness.       Past Medical History:  Diagnosis Date  . Allergy   . Asthma   . Depression   . Frequent headaches   . Migraine      Social History   Social History  . Marital status: Married    Spouse name: N/A  . Number of children: N/A  . Years of education: N/A   Occupational History  . Not on file.   Social History Main Topics  . Smoking status: Current Every Day Smoker    Packs/day: 1.50    Types: Cigarettes    Last attempt to quit: 10/15/2015  . Smokeless tobacco: Never Used  . Alcohol use 0.0 oz/week     Comment: social  . Drug use: Unknown  . Sexual activity: Not on file   Other Topics Concern  . Not on file   Social History Narrative   Married.   2 children   Works as an Scientist, water quality.   Enjoys spending time with her family.       Past Surgical History:  Procedure Laterality Date  . CHOLECYSTECTOMY  2001    Family History  Problem  Relation Age of Onset  . Arthritis Mother   . Hypertension Mother   . Hypertension Father   . Diabetes Father   . Mental illness Maternal Grandfather   . Breast cancer Neg Hx     Allergies  Allergen Reactions  . Cephalosporins Hives  . Penicillins Hives    Current Outpatient Prescriptions on File Prior to Visit  Medication Sig Dispense Refill  . Blood Glucose Monitoring Suppl (ONE TOUCH ULTRA MINI) w/Device KIT Use as instructed to test blood sugar 2 times daily 1 each 0  . glucose blood (ONE TOUCH ULTRA TEST) test strip Use as instructed to test blood sugar 2 times daily 100 each 11  . Lancet Devices (ONE TOUCH DELICA LANCING DEV) MISC Use as instructed to check blood sugar 2 times daily 100 each 11  . metoprolol succinate (TOPROL-XL) 25 MG 24 hr tablet TAKE ONE TABLET BY MOUTH ONCE DAILY 90 tablet 1  . ONETOUCH DELICA LANCETS FINE MISC Check blood sugar twice a day and as instructed. Dx E11.9 100 each 11   No current facility-administered medications on file prior to visit.     BP 132/80   Pulse 77   Temp 98.8 F (37.1 C) (Oral)   Ht '5\' 6"'$  (1.676 m)   Wt 224 lb  6.4 oz (101.8 kg)   SpO2 98%   BMI 36.22 kg/m    Objective:   Physical Exam  Constitutional: She appears well-nourished.  Neck: Neck supple.  Cardiovascular: Normal rate.   Pulmonary/Chest: Effort normal.  Musculoskeletal:       Right shoulder: She exhibits decreased range of motion and pain. She exhibits no tenderness.  Decrease in ROM with forward and posterior abduction. 5/5 strength to bilateral upper extremities.   Skin: Skin is warm and dry. No erythema.          Assessment & Plan:  Acute Shoulder Pain:  Present for the past 7 days, has been over exerting shoulder. Suspect symptoms are secondary to acute bursitis of the right shoulder given repetitive activity and over exertion. Good strength, low suspicion for rotator cuff involvement. Skin warm and dry.  Will treat with Naproxen 500 mg BID  and methocarbamol 500 mg TID PRN. Discussed stretching exercises, ice. She will update in 3 days if no improvement. Consider imaging at that time.  Sheral Flow, NP

## 2017-08-07 ENCOUNTER — Telehealth: Payer: Self-pay | Admitting: Primary Care

## 2017-08-07 DIAGNOSIS — M25511 Pain in right shoulder: Secondary | ICD-10-CM

## 2017-08-07 NOTE — Telephone Encounter (Signed)
Per DPR, left detail message of Kate's comments for patient. 

## 2017-08-07 NOTE — Telephone Encounter (Signed)
PT called to give update on her arm. Her arm is still in pain, about the same. Her arm throbs constantly unless it's in the L position. She is requesting a x-ray before taking steroids.

## 2017-08-07 NOTE — Telephone Encounter (Signed)
Please have patient come in for x-ray at her convenience. We will notify her of the results once received.

## 2017-08-24 ENCOUNTER — Other Ambulatory Visit: Payer: Self-pay | Admitting: Primary Care

## 2017-08-24 DIAGNOSIS — R519 Headache, unspecified: Secondary | ICD-10-CM

## 2017-08-24 DIAGNOSIS — R51 Headache: Principal | ICD-10-CM

## 2017-08-24 NOTE — Telephone Encounter (Signed)
Pt left v/m requesting to get refill topiramate 08/24/17. Pt request [facility]. [facility] [address]. Last refilled # 90 on 03/24/17 and last annual 06/18/17.

## 2017-08-24 NOTE — Telephone Encounter (Signed)
Refills sent to pharmacy. 

## 2017-09-18 ENCOUNTER — Other Ambulatory Visit: Payer: Managed Care, Other (non HMO)

## 2017-09-22 ENCOUNTER — Encounter: Payer: Self-pay | Admitting: Primary Care

## 2017-09-23 MED ORDER — PAROXETINE HCL 10 MG PO TABS: 10.0000 mg | ORAL_TABLET | Freq: Every day | ORAL | 2 refills | Status: DC

## 2017-10-16 ENCOUNTER — Encounter: Payer: Self-pay | Admitting: Primary Care

## 2017-10-16 ENCOUNTER — Other Ambulatory Visit: Payer: Self-pay | Admitting: Primary Care

## 2017-10-16 DIAGNOSIS — F419 Anxiety disorder, unspecified: Principal | ICD-10-CM

## 2017-10-16 DIAGNOSIS — F329 Major depressive disorder, single episode, unspecified: Secondary | ICD-10-CM

## 2017-10-16 MED ORDER — PAROXETINE HCL 20 MG PO TABS: 20.0000 mg | ORAL_TABLET | Freq: Every day | ORAL | 0 refills | Status: DC

## 2017-11-04 ENCOUNTER — Other Ambulatory Visit (INDEPENDENT_AMBULATORY_CARE_PROVIDER_SITE_OTHER): Payer: Managed Care, Other (non HMO)

## 2017-11-04 DIAGNOSIS — E119 Type 2 diabetes mellitus without complications: Secondary | ICD-10-CM

## 2017-11-04 LAB — LIPID PANEL
CHOL/HDL RATIO: 4
Cholesterol: 167 mg/dL (ref 0–200)
HDL: 40 mg/dL (ref >39.00)
LDL CALC: 102 mg/dL — AB (ref 0–99)
NONHDL: 126.54
Triglycerides: 124 mg/dL (ref 0.0–149.0)
VLDL: 24.8 mg/dL (ref 0.0–40.0)

## 2017-11-04 LAB — COMPREHENSIVE METABOLIC PANEL
ALT: 27 U/L (ref 0–35)
AST: 17 U/L (ref 0–37)
Albumin: 4.1 g/dL (ref 3.5–5.2)
Alkaline Phosphatase: 110 U/L (ref 39–117)
BILIRUBIN TOTAL: 0.6 mg/dL (ref 0.2–1.2)
BUN: 13 mg/dL (ref 6–23)
CALCIUM: 9.1 mg/dL (ref 8.4–10.5)
CO2: 24 meq/L (ref 19–32)
Chloride: 107 mEq/L (ref 96–112)
Creatinine, Ser: 0.88 mg/dL (ref 0.40–1.20)
GFR: 71.8 mL/min (ref >60.00)
Glucose, Bld: 111 mg/dL — ABNORMAL HIGH (ref 70–99)
Potassium: 4 mEq/L (ref 3.5–5.1)
Sodium: 137 mEq/L (ref 135–145)
Total Protein: 7.5 g/dL (ref 6.0–8.3)

## 2017-11-04 LAB — MICROALBUMIN / CREATININE URINE RATIO
Creatinine,U: 119.9 mg/dL
MICROALB/CREAT RATIO: 6.3 mg/g (ref 0.0–30.0)
Microalb, Ur: 7.5 mg/dL — ABNORMAL HIGH (ref 0.0–1.9)

## 2017-11-04 LAB — HEMOGLOBIN A1C: Hgb A1c MFr Bld: 6.6 % — ABNORMAL HIGH (ref 4.6–6.5)

## 2017-11-06 ENCOUNTER — Other Ambulatory Visit: Payer: Self-pay | Admitting: Primary Care

## 2017-11-06 ENCOUNTER — Encounter: Payer: Self-pay | Admitting: Primary Care

## 2017-11-06 DIAGNOSIS — I10 Essential (primary) hypertension: Secondary | ICD-10-CM

## 2017-11-06 MED ORDER — METOPROLOL SUCCINATE ER 25 MG PO TB24: 25.0000 mg | ORAL_TABLET | Freq: Every day | ORAL | 2 refills | Status: DC

## 2017-11-09 ENCOUNTER — Other Ambulatory Visit: Payer: Self-pay | Admitting: Primary Care

## 2017-11-09 DIAGNOSIS — E119 Type 2 diabetes mellitus without complications: Secondary | ICD-10-CM

## 2017-11-09 MED ORDER — METFORMIN HCL 500 MG PO TABS: ORAL_TABLET | ORAL | 0 refills | Status: DC

## 2018-02-02 ENCOUNTER — Other Ambulatory Visit: Payer: Self-pay | Admitting: Primary Care

## 2018-02-02 DIAGNOSIS — F419 Anxiety disorder, unspecified: Principal | ICD-10-CM

## 2018-02-02 DIAGNOSIS — F329 Major depressive disorder, single episode, unspecified: Secondary | ICD-10-CM

## 2018-02-02 NOTE — Telephone Encounter (Signed)
Refill sent to pharmacy.   

## 2018-03-16 ENCOUNTER — Ambulatory Visit (INDEPENDENT_AMBULATORY_CARE_PROVIDER_SITE_OTHER): Payer: Managed Care, Other (non HMO) | Admitting: Primary Care

## 2018-03-16 ENCOUNTER — Encounter: Payer: Self-pay | Admitting: Primary Care

## 2018-03-16 VITALS — BP 126/80 | HR 77 | Temp 98.0°F | Ht 66.0 in | Wt 219.8 lb

## 2018-03-16 DIAGNOSIS — M25511 Pain in right shoulder: Secondary | ICD-10-CM

## 2018-03-16 MED ORDER — PREDNISONE 20 MG PO TABS: ORAL_TABLET | ORAL | 0 refills | Status: DC

## 2018-03-16 MED ORDER — CYCLOBENZAPRINE HCL 10 MG PO TABS: 10.0000 mg | ORAL_TABLET | Freq: Three times a day (TID) | ORAL | 0 refills | Status: DC | PRN

## 2018-03-16 NOTE — Progress Notes (Signed)
Subjective:    Patient ID: Vickie Waters, female    DOB: 05/28/1966, 52 y.o.   MRN: [id number]  HPI  Ms. Vanderveer is a 52 year old female who presents today with a chief complaint of shoulder pain. Her pain is located to the right posterior shoulder mostly with radiation of pain to her right anterior shoulder and right upper extremity to her elbow. Her symptoms began one week ago.   She went to get a massage in an attempt to relieve her symptoms, this hasn't helped. Her massage therapist noticed a large tense muscle to her right posterior shoulder and attempted to work it out. She denies significant decrease in ROM and can still dress, blow dry her hair, etc.   She's been taking Excedrin PM, Aleve, Meloxicam, and topical products without improvement. She denies numbness/tingling but has noticed a "burning sensation.   She works daily on a computer using a mouse with her right shoulder.   Review of Systems  Musculoskeletal: Positive for myalgias.       Right shoulder pain  Skin: Negative for color change.  Neurological: Negative for weakness and numbness.       Past Medical History:  Diagnosis Date  . Allergy   . Asthma   . Depression   . Frequent headaches   . Migraine      Social History   Socioeconomic History  . Marital status: Married    Spouse name: Not on file  . Number of children: Not on file  . Years of education: Not on file  . Highest education level: Not on file  Occupational History  . Not on file  Social Needs  . Financial resource strain: Not on file  . Food insecurity:    Worry: Not on file    Inability: Not on file  . Transportation needs:    Medical: Not on file    Non-medical: Not on file  Tobacco Use  . Smoking status: Current Every Day Smoker    Packs/day: 1.50    Types: Cigarettes    Last attempt to quit: 10/15/2015    Years since quitting: 2.4  . Smokeless tobacco: Never Used  Substance and Sexual Activity  . Alcohol use: Yes   Alcohol/week: 0.0 oz    Comment: social  . Drug use: Not on file  . Sexual activity: Not on file  Lifestyle  . Physical activity:    Days per week: Not on file    Minutes per session: Not on file  . Stress: Not on file  Relationships  . Social connections:    Talks on phone: Not on file    Gets together: Not on file    Attends religious service: Not on file    Active member of club or organization: Not on file    Attends meetings of clubs or organizations: Not on file    Relationship status: Not on file  . Intimate partner violence:    Fear of current or ex partner: Not on file    Emotionally abused: Not on file    Physically abused: Not on file    Forced sexual activity: Not on file  Other Topics Concern  . Not on file  Social History Narrative   Married.   2 children   Works as an Scientist, water quality.   Enjoys spending time with her family.    Past Surgical History:  Procedure Laterality Date  . CHOLECYSTECTOMY  2001    Family History  Problem Relation Age of Onset  . Arthritis Mother   . Hypertension Mother   . Hypertension Father   . Diabetes Father   . Mental illness Maternal Grandfather   . Breast cancer Neg Hx     Allergies  Allergen Reactions  . Cephalosporins Hives  . Penicillins Hives    Current Outpatient Medications on File Prior to Visit  Medication Sig Dispense Refill  . Blood Glucose Monitoring Suppl (ONE TOUCH ULTRA MINI) w/Device KIT Use as instructed to test blood sugar 2 times daily 1 each 0  . glucose blood (ONE TOUCH ULTRA TEST) test strip Use as instructed to test blood sugar 2 times daily 100 each 11  . Lancet Devices (ONE TOUCH DELICA LANCING DEV) MISC Use as instructed to check blood sugar 2 times daily 100 each 11  . metFORMIN (GLUCOPHAGE) 500 MG tablet Take 1 tablet by mouth once daily for 2 weeks, then 1 tablet by mouth twice daily thereafter. 180 tablet 0  . metoprolol succinate (TOPROL-XL) 25 MG 24 hr tablet Take 1 tablet (25 mg  total) by mouth daily. 90 tablet 2  . ONETOUCH DELICA LANCETS FINE MISC Check blood sugar twice a day and as instructed. Dx E11.9 100 each 11  . PARoxetine (PAXIL) 20 MG tablet TAKE 1 TABLET BY MOUTH ONCE DAILY 90 tablet 0  . topiramate (TOPAMAX) 50 MG tablet TAKE 1 TABLET BY MOUTH AT BEDTIME 90 tablet 2  . methocarbamol (ROBAXIN) 500 MG tablet Take 1 tablet (500 mg total) by mouth every 8 (eight) hours as needed for muscle spasms. (Patient not taking: Reported on 03/16/2018) 30 tablet 0  . naproxen (NAPROSYN) 500 MG tablet Take 1 tablet (500 mg total) by mouth 2 (two) times daily with a meal. (Patient not taking: Reported on 03/16/2018) 30 tablet 0   No current facility-administered medications on file prior to visit.     BP 126/80   Pulse 77   Temp 98 F (36.7 C) (Oral)   Ht '5\' 6"'$  (1.676 m)   Wt 219 lb 12 oz (99.7 kg)   SpO2 99%   BMI 35.47 kg/m    Objective:   Physical Exam  Constitutional: She appears well-nourished.  Musculoskeletal:  Overall normal ROM, mild decrease in general ROM due to muscle tension. 5/5 strength to bilateral upper extremities. Obvious muscle tension to right posterior shoulder.   Skin: Skin is warm and dry. No erythema.          Assessment & Plan:  Acute Shoulder Pain:  Based on exam and HPI the source is right posterior shoulder/upper trapezius. Obvious muscle tension noted on exam. Rx for Flexeril and Prednisone course sent to pharmacy. Discussed heat/ice. Offered plain films for which she kindly declines, this is appropriate as there's no obvious bony involvement and low suspicion for rotator cuff involvement.  Follow up PRN.  Pleas Koch, NP

## 2018-03-16 NOTE — Patient Instructions (Signed)
Start prednisone 20 mg tablets. Take 2 tablets for 4 days, then 1 tablet for 4 days.  You may take the cyclobenzaprine (Flexeril) tablets every 8 hours as needed for muscle spasms. Caution as this may cause drowsiness.  Continue to use heat/ice for discomfort.  Try the exercises below. It was a pleasure to see you today!    Shoulder Exercises Ask your health care provider which exercises are safe for you. Do exercises exactly as told by your health care provider and adjust them as directed. It is normal to feel mild stretching, pulling, tightness, or discomfort as you do these exercises, but you should stop right away if you feel sudden pain or your pain gets worse.Do not begin these exercises until told by your health care provider. RANGE OF MOTION EXERCISES These exercises warm up your muscles and joints and improve the movement and flexibility of your shoulder. These exercises also help to relieve pain, numbness, and tingling. These exercises involve stretching your injured shoulder directly. Exercise A: Pendulum  1. Stand near a wall or a surface that you can hold onto for balance. 2. Bend at the waist and let your left / right arm hang straight down. Use your other arm to support you. Keep your back straight and do not lock your knees. 3. Relax your left / right arm and shoulder muscles, and move your hips and your trunk so your left / right arm swings freely. Your arm should swing because of the motion of your body, not because you are using your arm or shoulder muscles. 4. Keep moving your body so your arm swings in the following directions, as told by your health care provider: ? Side to side. ? Forward and backward. ? In clockwise and counterclockwise circles. 5. Continue each motion for __________ seconds, or for as long as told by your health care provider. 6. Slowly return to the starting position. Repeat __________ times. Complete this exercise __________ times a day. Exercise  B:Flexion, Standing  1. Stand and hold a broomstick, a cane, or a similar object. Place your hands a little more than shoulder-width apart on the object. Your left / right hand should be palm-up, and your other hand should be palm-down. 2. Keep your elbow straight and keep your shoulder muscles relaxed. Push the stick down with your healthy arm to raise your left / right arm in front of your body, and then over your head until you feel a stretch in your shoulder. ? Avoid shrugging your shoulder while you raise your arm. Keep your shoulder blade tucked down toward the middle of your back. 3. Hold for __________ seconds. 4. Slowly return to the starting position. Repeat __________ times. Complete this exercise __________ times a day. Exercise C: Abduction, Standing 1. Stand and hold a broomstick, a cane, or a similar object. Place your hands a little more than shoulder-width apart on the object. Your left / right hand should be palm-up, and your other hand should be palm-down. 2. While keeping your elbow straight and your shoulder muscles relaxed, push the stick across your body toward your left / right side. Raise your left / right arm to the side of your body and then over your head until you feel a stretch in your shoulder. ? Do not raise your arm above shoulder height, unless your health care provider tells you to do that. ? Avoid shrugging your shoulder while you raise your arm. Keep your shoulder blade tucked down toward the middle of  your back. 3. Hold for __________ seconds. 4. Slowly return to the starting position. Repeat __________ times. Complete this exercise __________ times a day. Exercise D:Internal Rotation  1. Place your left / right hand behind your back, palm-up. 2. Use your other hand to dangle an exercise band, a towel, or a similar object over your shoulder. Grasp the band with your left / right hand so you are holding onto both ends. 3. Gently pull up on the band until you  feel a stretch in the front of your left / right shoulder. ? Avoid shrugging your shoulder while you raise your arm. Keep your shoulder blade tucked down toward the middle of your back. 4. Hold for __________ seconds. 5. Release the stretch by letting go of the band and lowering your hands. Repeat __________ times. Complete this exercise __________ times a day. STRETCHING EXERCISES These exercises warm up your muscles and joints and improve the movement and flexibility of your shoulder. These exercises also help to relieve pain, numbness, and tingling. These exercises are done using your healthy shoulder to help stretch the muscles of your injured shoulder. Exercise E: Warehouse manager (External Rotation and Abduction)  1. Stand in a doorway with one of your feet slightly in front of the other. This is called a staggered stance. If you cannot reach your forearms to the door frame, stand facing a corner of a room. 2. Choose one of the following positions as told by your health care provider: ? Place your hands and forearms on the door frame above your head. ? Place your hands and forearms on the door frame at the height of your head. ? Place your hands on the door frame at the height of your elbows. 3. Slowly move your weight onto your front foot until you feel a stretch across your chest and in the front of your shoulders. Keep your head and chest upright and keep your abdominal muscles tight. 4. Hold for __________ seconds. 5. To release the stretch, shift your weight to your back foot. Repeat __________ times. Complete this stretch __________ times a day. Exercise F:Extension, Standing 1. Stand and hold a broomstick, a cane, or a similar object behind your back. ? Your hands should be a little wider than shoulder-width apart. ? Your palms should face away from your back. 2. Keeping your elbows straight and keeping your shoulder muscles relaxed, move the stick away from your body until you feel  a stretch in your shoulder. ? Avoid shrugging your shoulders while you move the stick. Keep your shoulder blade tucked down toward the middle of your back. 3. Hold for __________ seconds. 4. Slowly return to the starting position. Repeat __________ times. Complete this exercise __________ times a day. STRENGTHENING EXERCISES These exercises build strength and endurance in your shoulder. Endurance is the ability to use your muscles for a long time, even after they get tired. Exercise G:External Rotation  1. Sit in a stable chair without armrests. 2. Secure an exercise band at elbow height on your left / right side. 3. Place a soft object, such as a folded towel or a small pillow, between your left / right upper arm and your body to move your elbow a few inches away (about 10 cm) from your side. 4. Hold the end of the band so it is tight and there is no slack. 5. Keeping your elbow pressed against the soft object, move your left / right forearm out, away from your abdomen. Keep your  body steady so only your forearm moves. 6. Hold for __________ seconds. 7. Slowly return to the starting position. Repeat __________ times. Complete this exercise __________ times a day. Exercise H:Shoulder Abduction  1. Sit in a stable chair without armrests, or stand. 2. Hold a __________ weight in your left / right hand, or hold an exercise band with both hands. 3. Start with your arms straight down and your left / right palm facing in, toward your body. 4. Slowly lift your left / right hand out to your side. Do not lift your hand above shoulder height unless your health care provider tells you that this is safe. ? Keep your arms straight. ? Avoid shrugging your shoulder while you do this movement. Keep your shoulder blade tucked down toward the middle of your back. 5. Hold for __________ seconds. 6. Slowly lower your arm, and return to the starting position. Repeat __________ times. Complete this exercise  __________ times a day. Exercise I:Shoulder Extension 1. Sit in a stable chair without armrests, or stand. 2. Secure an exercise band to a stable object in front of you where it is at shoulder height. 3. Hold one end of the exercise band in each hand. Your palms should face each other. 4. Straighten your elbows and lift your hands up to shoulder height. 5. Step back, away from the secured end of the exercise band, until the band is tight and there is no slack. 6. Squeeze your shoulder blades together as you pull your hands down to the sides of your thighs. Stop when your hands are straight down by your sides. Do not let your hands go behind your body. 7. Hold for __________ seconds. 8. Slowly return to the starting position. Repeat __________ times. Complete this exercise __________ times a day. Exercise J:Standing Shoulder Row 1. Sit in a stable chair without armrests, or stand. 2. Secure an exercise band to a stable object in front of you so it is at waist height. 3. Hold one end of the exercise band in each hand. Your palms should be in a thumbs-up position. 4. Bend each of your elbows to an "L" shape (about 90 degrees) and keep your upper arms at your sides. 5. Step back until the band is tight and there is no slack. 6. Slowly pull your elbows back behind you. 7. Hold for __________ seconds. 8. Slowly return to the starting position. Repeat __________ times. Complete this exercise __________ times a day. Exercise K:Shoulder Press-Ups  1. Sit in a stable chair that has armrests. Sit upright, with your feet flat on the floor. 2. Put your hands on the armrests so your elbows are bent and your fingers are pointing forward. Your hands should be about even with the sides of your body. 3. Push down on the armrests and use your arms to lift yourself off of the chair. Straighten your elbows and lift yourself up as much as you comfortably can. ? Move your shoulder blades down, and avoid  letting your shoulders move up toward your ears. ? Keep your feet on the ground. As you get stronger, your feet should support less of your body weight as you lift yourself up. 4. Hold for __________ seconds. 5. Slowly lower yourself back into the chair. Repeat __________ times. Complete this exercise __________ times a day. Exercise L: Wall Push-Ups  1. Stand so you are facing a stable wall. Your feet should be about one arm-length away from the wall. 2. Lean forward and place  your palms on the wall at shoulder height. 3. Keep your feet flat on the floor as you bend your elbows and lean forward toward the wall. 4. Hold for __________ seconds. 5. Straighten your elbows to push yourself back to the starting position. Repeat __________ times. Complete this exercise __________ times a day. This information is not intended to replace advice given to you by your health care provider. Make sure you discuss any questions you have with your health care provider. Document Released: 09/17/2005 Document Revised: 07/28/2016 Document Reviewed: 07/15/2015 Elsevier Interactive Patient Education  2018 [employer].

## 2018-04-19 ENCOUNTER — Encounter: Payer: Self-pay | Admitting: Primary Care

## 2018-04-19 DIAGNOSIS — Z8349 Family history of other endocrine, nutritional and metabolic diseases: Secondary | ICD-10-CM

## 2018-04-22 ENCOUNTER — Other Ambulatory Visit (INDEPENDENT_AMBULATORY_CARE_PROVIDER_SITE_OTHER): Payer: Managed Care, Other (non HMO)

## 2018-04-22 DIAGNOSIS — Z8349 Family history of other endocrine, nutritional and metabolic diseases: Secondary | ICD-10-CM

## 2018-04-22 LAB — TSH: TSH: 1.58 u[IU]/mL (ref 0.35–4.50)

## 2018-04-30 ENCOUNTER — Encounter: Payer: Self-pay | Admitting: Internal Medicine

## 2018-04-30 ENCOUNTER — Ambulatory Visit (INDEPENDENT_AMBULATORY_CARE_PROVIDER_SITE_OTHER): Payer: Managed Care, Other (non HMO) | Admitting: Internal Medicine

## 2018-04-30 DIAGNOSIS — M5441 Lumbago with sciatica, right side: Secondary | ICD-10-CM

## 2018-04-30 MED ORDER — METHYLPREDNISOLONE ACETATE 80 MG/ML IJ SUSP
80.0000 mg | Freq: Once | INTRAMUSCULAR | Status: AC
Start: 2018-04-30 — End: 2018-04-30
  Administered 2018-04-30: 80 mg via INTRAMUSCULAR

## 2018-04-30 MED ORDER — METHOCARBAMOL 500 MG PO TABS: 500.0000 mg | ORAL_TABLET | Freq: Three times a day (TID) | ORAL | 0 refills | Status: DC | PRN

## 2018-04-30 NOTE — Addendum Note (Signed)
Addended by: Lurlean Nanny on: 04/30/2018 03:37 PM   Modules accepted: Orders

## 2018-04-30 NOTE — Patient Instructions (Signed)
Sciatica Sciatica is pain, numbness, weakness, or tingling along your sciatic nerve. The sciatic nerve starts in the lower back and goes down the back of each leg. Sciatica happens when this nerve is pinched or has pressure put on it. Sciatica usually goes away on its own or with treatment. Sometimes, sciatica may keep coming back (recur). Follow these instructions at home: Medicines  Take over-the-counter and prescription medicines only as told by your doctor.  Do not drive or use heavy machinery while taking prescription pain medicine. Managing pain  If directed, put ice on the affected area. ? Put ice in a plastic bag. ? Place a towel between your skin and the bag. ? Leave the ice on for 20 minutes, 2-3 times a day.  After icing, apply heat to the affected area before you exercise or as often as told by your doctor. Use the heat source that your doctor tells you to use, such as a moist heat pack or a heating pad. ? Place a towel between your skin and the heat source. ? Leave the heat on for 20-30 minutes. ? Remove the heat if your skin turns bright red. This is especially important if you are unable to feel pain, heat, or cold. You may have a greater risk of getting burned. Activity  Return to your normal activities as told by your doctor. Ask your doctor what activities are safe for you. ? Avoid activities that make your sciatica worse.  Take short rests during the day. Rest in a lying or standing position. This is usually better than sitting to rest. ? When you rest for a long time, do some physical activity or stretching between periods of rest. ? Avoid sitting for a long time without moving. Get up and move around at least one time each hour.  Exercise and stretch regularly, as told by your doctor.  Do not lift anything that is heavier than 10 lb (4.5 kg) while you have symptoms of sciatica. ? Avoid lifting heavy things even when you do not have symptoms. ? Avoid lifting heavy  things over and over.  When you lift objects, always lift in a way that is safe for your body. To do this, you should: ? Bend your knees. ? Keep the object close to your body. ? Avoid twisting. General instructions  Use good posture. ? Avoid leaning forward when you are sitting. ? Avoid hunching over when you are standing.  Stay at a healthy weight.  Wear comfortable shoes that support your feet. Avoid wearing high heels.  Avoid sleeping on a mattress that is too soft or too hard. You might have less pain if you sleep on a mattress that is firm enough to support your back.  Keep all follow-up visits as told by your doctor. This is important. Contact a doctor if:  You have pain that: ? Wakes you up when you are sleeping. ? Gets worse when you lie down. ? Is worse than the pain you have had in the past. ? Lasts longer than 4 weeks.  You lose weight for without trying. Get help right away if:  You cannot control when you pee (urinate) or poop (have a bowel movement).  You have weakness in any of these areas and it gets worse. ? Lower back. ? Lower belly (pelvis). ? Butt (buttocks). ? Legs.  You have redness or swelling of your back.  You have a burning feeling when you pee. This information is not intended to replace   advice given to you by your health care provider. Make sure you discuss any questions you have with your health care provider. Document Released: 08/12/2008 Document Revised: 04/10/2016 Document Reviewed: 07/13/2015 Elsevier Interactive Patient Education  2018 [employer]  

## 2018-04-30 NOTE — Progress Notes (Signed)
Subjective:    Patient ID: Vickie Waters, female    DOB: 16-Mar-1966, 52 y.o.   MRN: [id number]  HPI  Pt presents to the clinic today with c/o low back pain. This started. She describes the pains as shooting and throbbing. The pain radiates into her right hip and leg. She report associated numbness and tingling in the right leg but denies weakness. She denies any injury to the area but noticed the pain after riding go Karts. She denies loss of bowel or bladder. She has tried heat, ice, [employer], Aleve and Tylenol with minimal relief.  Review of Systems      Past Medical History:  Diagnosis Date  . Allergy   . Asthma   . Depression   . Frequent headaches   . Migraine     Current Outpatient Medications  Medication Sig Dispense Refill  . Blood Glucose Monitoring Suppl (ONE TOUCH ULTRA MINI) w/Device KIT Use as instructed to test blood sugar 2 times daily 1 each 0  . glucose blood (ONE TOUCH ULTRA TEST) test strip Use as instructed to test blood sugar 2 times daily 100 each 11  . Lancet Devices (ONE TOUCH DELICA LANCING DEV) MISC Use as instructed to check blood sugar 2 times daily 100 each 11  . metoprolol succinate (TOPROL-XL) 25 MG 24 hr tablet Take 1 tablet (25 mg total) by mouth daily. 90 tablet 2  . ONETOUCH DELICA LANCETS FINE MISC Check blood sugar twice a day and as instructed. Dx E11.9 100 each 11  . PARoxetine (PAXIL) 20 MG tablet TAKE 1 TABLET BY MOUTH ONCE DAILY 90 tablet 0  . topiramate (TOPAMAX) 50 MG tablet TAKE 1 TABLET BY MOUTH AT BEDTIME 90 tablet 2  . metFORMIN (GLUCOPHAGE) 500 MG tablet Take 1 tablet by mouth once daily for 2 weeks, then 1 tablet by mouth twice daily thereafter. (Patient not taking: Reported on 04/30/2018) 180 tablet 0   No current facility-administered medications for this visit.     Allergies  Allergen Reactions  . Cephalosporins Hives  . Penicillins Hives    Family History  Problem Relation Age of Onset  . Arthritis Mother   .  Hypertension Mother   . Hypertension Father   . Diabetes Father   . Mental illness Maternal Grandfather   . Breast cancer Neg Hx     Social History   Socioeconomic History  . Marital status: Married    Spouse name: Not on file  . Number of children: Not on file  . Years of education: Not on file  . Highest education level: Not on file  Occupational History  . Not on file  Social Needs  . Financial resource strain: Not on file  . Food insecurity:    Worry: Not on file    Inability: Not on file  . Transportation needs:    Medical: Not on file    Non-medical: Not on file  Tobacco Use  . Smoking status: Current Every Day Smoker    Packs/day: 1.50    Types: Cigarettes    Last attempt to quit: 10/15/2015    Years since quitting: 2.5  . Smokeless tobacco: Never Used  Substance and Sexual Activity  . Alcohol use: Yes    Alcohol/week: 0.0 oz    Comment: social  . Drug use: Not on file  . Sexual activity: Not on file  Lifestyle  . Physical activity:    Days per week: Not on file    Minutes  per session: Not on file  . Stress: Not on file  Relationships  . Social connections:    Talks on phone: Not on file    Gets together: Not on file    Attends religious service: Not on file    Active member of club or organization: Not on file    Attends meetings of clubs or organizations: Not on file    Relationship status: Not on file  . Intimate partner violence:    Fear of current or ex partner: Not on file    Emotionally abused: Not on file    Physically abused: Not on file    Forced sexual activity: Not on file  Other Topics Concern  . Not on file  Social History Narrative   Married.   2 children   Works as an Scientist, water quality.   Enjoys spending time with her family.     Constitutional: Denies fever, malaise, fatigue, headache or abrupt weight changes.  Gastrointestinal: Denies abdominal pain, bloating, constipation, diarrhea or blood in the stool.  GU: Denies  urgency, frequency, pain with urination, burning sensation, blood in urine, odor or discharge. Musculoskeletal: Pt reports low back, right leg pain. Denies joint swelling.  Skin: Denies redness, rashes, lesions or ulcercations.  Neurological: Pt reports numbness and tingling in right leg. Denies dizziness, difficulty with memory, difficulty with speech or problems with balance and coordination.    No other specific complaints in a complete review of systems (except as listed in HPI above).  Objective:   Physical Exam BP 128/84   Pulse 76   Temp 98.2 F (36.8 C) (Oral)   Wt 216 lb (98 kg)   SpO2 97%   BMI 34.86 kg/m    Wt Readings from Last 3 Encounters:  04/30/18 216 lb (98 kg)  03/16/18 219 lb 12 oz (99.7 kg)  08/04/17 224 lb 6.4 oz (101.8 kg)    General: Appears her stated age, obese in NAD. Skin: Warm, dry and intact.  Musculoskeletal: Decreased flexion and extension of the spine. Normal rotation and lateral bending. No pain with palpation over the lumbar spine or SI joints. Strength 5/ 5BUE/BLE. Neurological: Alert and oriented. Positive SLR on the right.  BMET    Component Value Date/Time   NA 137 11/04/2017 0900   K 4.0 11/04/2017 0900   CL 107 11/04/2017 0900   CO2 24 11/04/2017 0900   GLUCOSE 111 (H) 11/04/2017 0900   BUN 13 11/04/2017 0900   CREATININE 0.88 11/04/2017 0900   CALCIUM 9.1 11/04/2017 0900   GFRNONAA >60 05/10/2017 1953   GFRAA >60 05/10/2017 1953    Lipid Panel     Component Value Date/Time   CHOL 167 11/04/2017 0900   TRIG 124.0 11/04/2017 0900   HDL 40.00 11/04/2017 0900   CHOLHDL 4 11/04/2017 0900   VLDL 24.8 11/04/2017 0900   LDLCALC 102 (H) 11/04/2017 0900    CBC    Component Value Date/Time   WBC 10.3 05/10/2017 1953   RBC 4.82 05/10/2017 1953   HGB 14.8 05/10/2017 1953   HCT 43.2 05/10/2017 1953   HCT 45.1 11/29/2012 0955   PLT 234 05/10/2017 1953   MCV 89.5 05/10/2017 1953   MCH 30.7 05/10/2017 1953   MCHC 34.3  05/10/2017 1953   RDW 13.7 05/10/2017 1953    Hgb A1C Lab Results  Component Value Date   HGBA1C 6.6 (H) 11/04/2017            Assessment & Plan:  Acute Low Back Pain with Right Side Sciatica:  80 mg Depo IM today Naproxen 500 mg BID x 1 week eRx for Robaxin 500 mg QHS prn If symptoms persist or worsen, consider xray lumbar spine and PT  Return precautions discussed Webb Silversmith, NP

## 2018-05-16 ENCOUNTER — Other Ambulatory Visit: Payer: Self-pay | Admitting: Primary Care

## 2018-05-16 DIAGNOSIS — F419 Anxiety disorder, unspecified: Principal | ICD-10-CM

## 2018-05-16 DIAGNOSIS — F329 Major depressive disorder, single episode, unspecified: Secondary | ICD-10-CM

## 2018-05-17 ENCOUNTER — Encounter: Payer: Self-pay | Admitting: Primary Care

## 2018-07-22 ENCOUNTER — Other Ambulatory Visit: Payer: Self-pay | Admitting: Primary Care

## 2018-07-22 DIAGNOSIS — Z1231 Encounter for screening mammogram for malignant neoplasm of breast: Secondary | ICD-10-CM

## 2018-08-05 ENCOUNTER — Ambulatory Visit
Admission: RE | Admit: 2018-08-05 | Discharge: 2018-08-05 | Disposition: A | Discharge status: Home / Self Care | Payer: Managed Care, Other (non HMO) | Source: Ambulatory Visit | Attending: Primary Care | Admitting: Primary Care

## 2018-08-05 DIAGNOSIS — Z1231 Encounter for screening mammogram for malignant neoplasm of breast: Secondary | ICD-10-CM

## 2018-08-23 ENCOUNTER — Other Ambulatory Visit: Payer: Self-pay | Admitting: Primary Care

## 2018-08-23 DIAGNOSIS — I10 Essential (primary) hypertension: Secondary | ICD-10-CM

## 2018-08-23 DIAGNOSIS — F329 Major depressive disorder, single episode, unspecified: Secondary | ICD-10-CM

## 2018-08-23 DIAGNOSIS — R51 Headache: Secondary | ICD-10-CM

## 2018-08-23 DIAGNOSIS — F419 Anxiety disorder, unspecified: Principal | ICD-10-CM

## 2018-08-23 DIAGNOSIS — R519 Headache, unspecified: Secondary | ICD-10-CM

## 2018-08-23 NOTE — Telephone Encounter (Unsigned)
Copied from [place] [id number]. Topic: Quick Communication - Rx Refill/Question >> Aug 23, 2018  3:14 PM Scherrie Gerlach wrote: Medication: metoprolol succinate (TOPROL-XL) 25 MG 24 hr tablet topiramate (TOPAMAX) 50 MG tablet PARoxetine (PAXIL) 20 MG tablet  Pt has CPE scheduled 10/19/18, but needs enough med to get her through to this appt. [place] [address] [place] [phone] (Phone) [phone] (Fax)

## 2018-08-24 MED ORDER — METOPROLOL SUCCINATE ER 25 MG PO TB24: 25.0000 mg | ORAL_TABLET | Freq: Every day | ORAL | 0 refills | Status: DC

## 2018-08-24 MED ORDER — PAROXETINE HCL 20 MG PO TABS: ORAL_TABLET | ORAL | 0 refills | Status: DC

## 2018-08-24 NOTE — Telephone Encounter (Signed)
Did she stop taking her Topamax? She would have run out of this 3 months ago. Will send refills of metoprolol and paroxetine to pharmacy.

## 2018-08-24 NOTE — Telephone Encounter (Signed)
Paxil last filled 05/17/18 # 90 refills 0  Topamax last filled 08/21/17 # 90 refills 2 Metoprolol last filled 11/06/17 # 90 refills 2

## 2018-08-27 MED ORDER — TOPIRAMATE 50 MG PO TABS: 50.0000 mg | ORAL_TABLET | Freq: Every day | ORAL | 0 refills | Status: DC

## 2018-08-27 NOTE — Telephone Encounter (Signed)
Noted, refill sent to pharmacy. 

## 2018-08-27 NOTE — Telephone Encounter (Addendum)
Spoken to patient. She stated that she been taking 1/2 tablet of the Topamax.  She is not explain why she started to cut in half. She like a refill and she will take whole 50 mg.  Also patient stop taking metformin as prescribed. It was giving her stomach issues so she stopped. She does not want to take any thing new. She stated that she is working on her diet and will discuss this with Anda Kraft on next appt on 10/19/2018.

## 2018-10-19 ENCOUNTER — Encounter: Payer: Self-pay | Admitting: Primary Care

## 2018-10-19 ENCOUNTER — Ambulatory Visit (INDEPENDENT_AMBULATORY_CARE_PROVIDER_SITE_OTHER): Payer: Managed Care, Other (non HMO) | Admitting: Primary Care

## 2018-10-19 VITALS — BP 126/82 | HR 78 | Temp 98.2°F | Ht 66.0 in | Wt 215.2 lb

## 2018-10-19 DIAGNOSIS — Z0000 Encounter for general adult medical examination without abnormal findings: Secondary | ICD-10-CM

## 2018-10-19 DIAGNOSIS — Z1211 Encounter for screening for malignant neoplasm of colon: Secondary | ICD-10-CM

## 2018-10-19 DIAGNOSIS — G43101 Migraine with aura, not intractable, with status migrainosus: Secondary | ICD-10-CM

## 2018-10-19 DIAGNOSIS — Z23 Encounter for immunization: Secondary | ICD-10-CM

## 2018-10-19 DIAGNOSIS — E119 Type 2 diabetes mellitus without complications: Secondary | ICD-10-CM

## 2018-10-19 DIAGNOSIS — I10 Essential (primary) hypertension: Secondary | ICD-10-CM | POA: Diagnosis not present

## 2018-10-19 DIAGNOSIS — F419 Anxiety disorder, unspecified: Secondary | ICD-10-CM

## 2018-10-19 DIAGNOSIS — F329 Major depressive disorder, single episode, unspecified: Secondary | ICD-10-CM

## 2018-10-19 LAB — COMPREHENSIVE METABOLIC PANEL
ALT: 24 U/L (ref 0–35)
AST: 19 U/L (ref 0–37)
Albumin: 4.2 g/dL (ref 3.5–5.2)
Alkaline Phosphatase: 98 U/L (ref 39–117)
BUN: 10 mg/dL (ref 6–23)
CO2: 24 mEq/L (ref 19–32)
Calcium: 9.5 mg/dL (ref 8.4–10.5)
Chloride: 107 mEq/L (ref 96–112)
Creatinine, Ser: 0.91 mg/dL (ref 0.40–1.20)
GFR: 68.82 mL/min (ref >60.00)
GLUCOSE: 104 mg/dL — AB (ref 70–99)
Potassium: 4.3 mEq/L (ref 3.5–5.1)
Sodium: 138 mEq/L (ref 135–145)
TOTAL PROTEIN: 7.5 g/dL (ref 6.0–8.3)
Total Bilirubin: 0.6 mg/dL (ref 0.2–1.2)

## 2018-10-19 LAB — LIPID PANEL
Cholesterol: 185 mg/dL (ref 0–200)
HDL: 43 mg/dL (ref >39.00)
LDL Cholesterol: 122 mg/dL — ABNORMAL HIGH (ref 0–99)
NONHDL: 141.98
Total CHOL/HDL Ratio: 4
Triglycerides: 99 mg/dL (ref 0.0–149.0)
VLDL: 19.8 mg/dL (ref 0.0–40.0)

## 2018-10-19 LAB — MICROALBUMIN / CREATININE URINE RATIO
Creatinine,U: 79.9 mg/dL
Microalb Creat Ratio: 11 mg/g (ref 0.0–30.0)
Microalb, Ur: 8.8 mg/dL — ABNORMAL HIGH (ref 0.0–1.9)

## 2018-10-19 LAB — HEMOGLOBIN A1C: Hgb A1c MFr Bld: 6.1 % (ref 4.6–6.5)

## 2018-10-19 MED ORDER — ZOSTER VAC RECOMB ADJUVANTED 50 MCG/0.5ML IM SUSR: 0.5000 mL | Freq: Once | INTRAMUSCULAR | 1 refills | Status: AC

## 2018-10-19 NOTE — Assessment & Plan Note (Addendum)
Stopped taking Metformin 3 months ago due to GI upset.  Repeat A1C pending today. Consider enteric coated metformin. Pneumonia vaccination UTD. Foot exam today. Eye exam due next week.  Discussed the importance of a healthy diet and regular exercise in order for weight loss, and to reduce the risk of any potential medical problems.  Follow up in 6 months or sooner based off of A1C result.

## 2018-10-19 NOTE — Assessment & Plan Note (Signed)
Doing well on Paxil, denies SI/HI. Continue same.

## 2018-10-19 NOTE — Addendum Note (Signed)
Addended by: Jacqualin Combes on: 10/19/2018 01:30 PM   Modules accepted: Orders

## 2018-10-19 NOTE — Assessment & Plan Note (Signed)
BP stable in the office today, continue metoprolol succinate daily.

## 2018-10-19 NOTE — Patient Instructions (Addendum)
Take the shingles vaccination to your pharmacy.  Stop by the lab prior to leaving today. I will notify you of your results once received.   It is important that you improve your diet. Please limit carbohydrates in the form of white bread, rice, pasta, sweets, fast food, fried food, sugary drinks, etc. Increase your consumption of fresh fruits and vegetables, whole grains, lean protein.  Ensure you are consuming 64 ounces of water daily.  Start exercising. You should be getting 150 minutes of moderate intensity exercise weekly.  We will see you next year or sooner if needed.   Preventive Care 40-64 Years, Female Preventive care refers to lifestyle choices and visits with your health care provider that can promote health and wellness. What does preventive care include?  A yearly physical exam. This is also called an annual well check.  Dental exams once or twice a year.  Routine eye exams. Ask your health care provider how often you should have your eyes checked.  Personal lifestyle choices, including: ? Daily care of your teeth and gums. ? Regular physical activity. ? Eating a healthy diet. ? Avoiding tobacco and drug use. ? Limiting alcohol use. ? Practicing safe sex. ? Taking low-dose aspirin daily starting at age 67. ? Taking vitamin and mineral supplements as recommended by your health care provider. What happens during an annual well check? The services and screenings done by your health care provider during your annual well check will depend on your age, overall health, lifestyle risk factors, and family history of disease. Counseling Your health care provider may ask you questions about your:  Alcohol use.  Tobacco use.  Drug use.  Emotional well-being.  Home and relationship well-being.  Sexual activity.  Eating habits.  Work and work Statistician.  Method of birth control.  Menstrual cycle.  Pregnancy history.  Screening You may have the following  tests or measurements:  Height, weight, and BMI.  Blood pressure.  Lipid and cholesterol levels. These may be checked every 5 years, or more frequently if you are over 31 years old.  Skin check.  Lung cancer screening. You may have this screening every year starting at age 78 if you have a 30-pack-year history of smoking and currently smoke or have quit within the past 15 years.  Fecal occult blood test (FOBT) of the stool. You may have this test every year starting at age 25.  Flexible sigmoidoscopy or colonoscopy. You may have a sigmoidoscopy every 5 years or a colonoscopy every 10 years starting at age 14.  Hepatitis C blood test.  Hepatitis B blood test.  Sexually transmitted disease (STD) testing.  Diabetes screening. This is done by checking your blood sugar (glucose) after you have not eaten for a while (fasting). You may have this done every 1-3 years.  Mammogram. This may be done every 1-2 years. Talk to your health care provider about when you should start having regular mammograms. This may depend on whether you have a family history of breast cancer.  BRCA-related cancer screening. This may be done if you have a family history of breast, ovarian, tubal, or peritoneal cancers.  Pelvic exam and Pap test. This may be done every 3 years starting at age 72. Starting at age 59, this may be done every 5 years if you have a Pap test in combination with an HPV test.  Bone density scan. This is done to screen for osteoporosis. You may have this scan if you are at high risk  for osteoporosis.  Discuss your test results, treatment options, and if necessary, the need for more tests with your health care provider. Vaccines Your health care provider may recommend certain vaccines, such as:  Influenza vaccine. This is recommended every year.  Tetanus, diphtheria, and acellular pertussis (Tdap, Td) vaccine. You may need a Td booster every 10 years.  Varicella vaccine. You may need  this if you have not been vaccinated.  Zoster vaccine. You may need this after age 16.  Measles, mumps, and rubella (MMR) vaccine. You may need at least one dose of MMR if you were born in 1957 or later. You may also need a second dose.  Pneumococcal 13-valent conjugate (PCV13) vaccine. You may need this if you have certain conditions and were not previously vaccinated.  Pneumococcal polysaccharide (PPSV23) vaccine. You may need one or two doses if you smoke cigarettes or if you have certain conditions.  Meningococcal vaccine. You may need this if you have certain conditions.  Hepatitis A vaccine. You may need this if you have certain conditions or if you travel or work in places where you may be exposed to hepatitis A.  Hepatitis B vaccine. You may need this if you have certain conditions or if you travel or work in places where you may be exposed to hepatitis B.  Haemophilus influenzae type b (Hib) vaccine. You may need this if you have certain conditions.  Talk to your health care provider about which screenings and vaccines you need and how often you need them. This information is not intended to replace advice given to you by your health care provider. Make sure you discuss any questions you have with your health care provider. Document Released: 11/30/2015 Document Revised: 07/23/2016 Document Reviewed: 09/04/2015 Elsevier Interactive Patient Education  [employer].

## 2018-10-19 NOTE — Assessment & Plan Note (Signed)
Tetanus due today, provided. Provided influenza vaccination. Pap smear UTD, due in 2021. Mammogram UTD. Colonoscopy due, referral to GI placed. Discussed the importance of a healthy diet and regular exercise in order for weight loss, and to reduce the risk of any potential medical problems. Exam unremarkable. Labs pending. Follow up in 1 year for CPE.

## 2018-10-19 NOTE — Assessment & Plan Note (Signed)
Doing well on Topamax 25 mg. Continue same.

## 2018-10-19 NOTE — Progress Notes (Signed)
Subjective:    Patient ID: Vickie Waters, female    DOB: [birth year], 52 y.o.   MRN: [id number]  HPI  Vickie Waters is a 52 year old female who presents today for complete physical.  Immunizations: -Tetanus: Completed in 2009 -Influenza: Due today -Pneumonia: Completed in 2018  Diet: She endorses a poor diet. Breakfast: Muffins, fast food Lunch: Fast food Dinner: Take out food, restaurants  Snacks: Sometimes, crackers, chips Desserts: 2-3 times weekly  Beverages: Sweet tea, little water, diet soda  Exercise: She does not exercise  Eye exam: Due next week Dental exam: Completes semi-annually  Colonoscopy: Due Pap Smear: Completed in 2018 Mammogram: Completed in 2019   BP Readings from Last 3 Encounters:  10/19/18 126/82  04/30/18 128/84  03/16/18 126/80     Review of Systems  Constitutional: Negative for unexpected weight change.  HENT: Negative for rhinorrhea.   Respiratory: Negative for cough and shortness of breath.   Cardiovascular: Negative for chest pain.  Gastrointestinal: Negative for constipation and diarrhea.  Genitourinary: Negative for difficulty urinating.  Musculoskeletal: Negative for arthralgias.  Skin: Negative for rash.  Allergic/Immunologic: Negative for environmental allergies.  Neurological: Positive for headaches. Negative for dizziness and numbness.  Psychiatric/Behavioral: Negative for suicidal ideas.       Past Medical History:  Diagnosis Date  . Allergy   . Asthma   . Depression   . Frequent headaches   . Migraine      Social History   Socioeconomic History  . Marital status: Married    Spouse name: Not on file  . Number of children: Not on file  . Years of education: Not on file  . Highest education level: Not on file  Occupational History  . Not on file  Social Needs  . Financial resource strain: Not on file  . Food insecurity:    Worry: Not on file    Inability: Not on file  . Transportation needs:    Medical: Not on  file    Non-medical: Not on file  Tobacco Use  . Smoking status: Current Every Day Smoker    Packs/day: 1.50    Types: Cigarettes    Last attempt to quit: 10/15/2015    Years since quitting: 3.0  . Smokeless tobacco: Never Used  Substance and Sexual Activity  . Alcohol use: Yes    Alcohol/week: 0.0 standard drinks    Comment: social  . Drug use: Not on file  . Sexual activity: Not on file  Lifestyle  . Physical activity:    Days per week: Not on file    Minutes per session: Not on file  . Stress: Not on file  Relationships  . Social connections:    Talks on phone: Not on file    Gets together: Not on file    Attends religious service: Not on file    Active member of club or organization: Not on file    Attends meetings of clubs or organizations: Not on file    Relationship status: Not on file  . Intimate partner violence:    Fear of current or ex partner: Not on file    Emotionally abused: Not on file    Physically abused: Not on file    Forced sexual activity: Not on file  Other Topics Concern  . Not on file  Social History Narrative   Married.   2 children   Works as an Scientist, water quality.   Enjoys spending time with her family.  Past Surgical History:  Procedure Laterality Date  . CHOLECYSTECTOMY  2001    Family History  Problem Relation Age of Onset  . Arthritis Mother   . Hypertension Mother   . Hypertension Father   . Diabetes Father   . Mental illness Maternal Grandfather   . Breast cancer Neg Hx     Allergies  Allergen Reactions  . Cephalosporins Hives  . Penicillins Hives    Current Outpatient Medications on File Prior to Visit  Medication Sig Dispense Refill  . Blood Glucose Monitoring Suppl (ONE TOUCH ULTRA MINI) w/Device KIT Use as instructed to test blood sugar 2 times daily 1 each 0  . glucose blood (ONE TOUCH ULTRA TEST) test strip USE ONE STRIP TO CHECK GLUCOSE TWICE DAILY 100 each 1  . Lancet Devices (ONE TOUCH DELICA LANCING DEV)  MISC Use as instructed to check blood sugar 2 times daily 100 each 11  . metoprolol succinate (TOPROL-XL) 25 MG 24 hr tablet Take 1 tablet (25 mg total) by mouth daily. 90 tablet 0  . ONETOUCH DELICA LANCETS FINE MISC Check blood sugar twice a day and as instructed. Dx E11.9 100 each 11  . PARoxetine (PAXIL) 20 MG tablet Take 1 tablet by mouth once daily for anxiety and depression. 90 tablet 0  . topiramate (TOPAMAX) 50 MG tablet Take 1 tablet (50 mg total) by mouth at bedtime. For migraine prevention. (Patient taking differently: Take 25 mg by mouth at bedtime. For migraine prevention.) 90 tablet 0  . metFORMIN (GLUCOPHAGE) 500 MG tablet Take 1 tablet by mouth once daily for 2 weeks, then 1 tablet by mouth twice daily thereafter. (Patient not taking: Reported on 10/19/2018) 180 tablet 0  . methocarbamol (ROBAXIN) 500 MG tablet Take 1 tablet (500 mg total) by mouth every 8 (eight) hours as needed for muscle spasms. (Patient not taking: Reported on 10/19/2018) 15 tablet 0   No current facility-administered medications on file prior to visit.     BP 126/82   Pulse 78   Temp 98.2 F (36.8 C) (Oral)   Ht '5\' 6"'$  (1.676 m)   Wt 215 lb 4 oz (97.6 kg)   SpO2 98%   BMI 34.74 kg/m    Objective:   Physical Exam  Constitutional: She is oriented to person, place, and time. She appears well-nourished.  HENT:  Mouth/Throat: No oropharyngeal exudate.  Eyes: Pupils are equal, round, and reactive to light. EOM are normal.  Neck: Neck supple. No thyromegaly present.  Cardiovascular: Normal rate and regular rhythm.  Respiratory: Effort normal and breath sounds normal.  GI: Soft. Bowel sounds are normal. There is no tenderness.  Musculoskeletal: Normal range of motion.  Neurological: She is alert and oriented to person, place, and time.  Skin: Skin is warm and dry.  Psychiatric: She has a normal mood and affect.           Assessment & Plan:

## 2018-12-18 ENCOUNTER — Other Ambulatory Visit: Payer: Self-pay | Admitting: Primary Care

## 2018-12-18 DIAGNOSIS — F419 Anxiety disorder, unspecified: Principal | ICD-10-CM

## 2018-12-18 DIAGNOSIS — R51 Headache: Secondary | ICD-10-CM

## 2018-12-18 DIAGNOSIS — R519 Headache, unspecified: Secondary | ICD-10-CM

## 2018-12-18 DIAGNOSIS — I10 Essential (primary) hypertension: Secondary | ICD-10-CM

## 2018-12-18 DIAGNOSIS — F32A Depression, unspecified: Secondary | ICD-10-CM

## 2018-12-18 DIAGNOSIS — F329 Major depressive disorder, single episode, unspecified: Secondary | ICD-10-CM

## 2019-01-10 ENCOUNTER — Telehealth: Payer: Self-pay

## 2019-01-10 NOTE — Telephone Encounter (Signed)
Contacted patient to see if she would like to schedule her colonoscopy.  Referral is from 10/20/19 for screening colonoscopy.  She said she will call back to schedule.  Thanks [employer]

## 2019-01-10 NOTE — Telephone Encounter (Signed)
-----   Message from Vanetta Mulders, [place] sent at 10/29/2018  2:55 PM EST ----- Regarding: Call Pt to Schedule Colonoscopy for March 2020 Patient has requested me to call her back in January or February to schedule her for colonoscopy in March of 2020.  Thanks [employer]

## 2019-01-18 ENCOUNTER — Encounter: Payer: Self-pay | Admitting: *Deleted

## 2019-01-28 ENCOUNTER — Encounter: Payer: Self-pay | Admitting: Primary Care

## 2019-01-28 LAB — HM DIABETES EYE EXAM

## 2019-03-29 ENCOUNTER — Other Ambulatory Visit: Payer: Self-pay | Admitting: Primary Care

## 2019-03-29 DIAGNOSIS — F329 Major depressive disorder, single episode, unspecified: Secondary | ICD-10-CM

## 2019-03-29 DIAGNOSIS — F419 Anxiety disorder, unspecified: Secondary | ICD-10-CM

## 2019-03-29 NOTE — Telephone Encounter (Signed)
Patient's out of her medication and she needs it refilled asap.  Patient had called in to get a refill and pharmacy said they sent it and didn't get a response.  I let patient know the last request I saw was February.

## 2019-05-06 ENCOUNTER — Telehealth: Payer: Self-pay | Admitting: Primary Care

## 2019-05-06 NOTE — Telephone Encounter (Signed)
Best number [phone]  Pt called wanting to get an appointment today.  She stated for last 4 weeks she has had a nasty taste and smell in mouth/nose  Made appointment for Monday 6/22  Is it ok to wait

## 2019-05-06 NOTE — Telephone Encounter (Signed)
Please advise 

## 2019-05-06 NOTE — Telephone Encounter (Signed)
Yes, okay to wait as long as she's not had severe sore throat, fevers, difficulty swallowing.

## 2019-05-09 ENCOUNTER — Other Ambulatory Visit: Payer: Managed Care, Other (non HMO)

## 2019-05-09 ENCOUNTER — Encounter: Payer: Self-pay | Admitting: Primary Care

## 2019-05-09 ENCOUNTER — Telehealth: Payer: Self-pay | Admitting: *Deleted

## 2019-05-09 ENCOUNTER — Ambulatory Visit (INDEPENDENT_AMBULATORY_CARE_PROVIDER_SITE_OTHER): Payer: Managed Care, Other (non HMO) | Admitting: Primary Care

## 2019-05-09 VITALS — Wt 215.0 lb

## 2019-05-09 DIAGNOSIS — Z20822 Contact with and (suspected) exposure to covid-19: Secondary | ICD-10-CM

## 2019-05-09 DIAGNOSIS — K219 Gastro-esophageal reflux disease without esophagitis: Secondary | ICD-10-CM | POA: Diagnosis not present

## 2019-05-09 DIAGNOSIS — J309 Allergic rhinitis, unspecified: Secondary | ICD-10-CM | POA: Diagnosis not present

## 2019-05-09 DIAGNOSIS — R432 Parageusia: Secondary | ICD-10-CM | POA: Diagnosis not present

## 2019-05-09 DIAGNOSIS — Z20828 Contact with and (suspected) exposure to other viral communicable diseases: Secondary | ICD-10-CM | POA: Diagnosis not present

## 2019-05-09 MED ORDER — FLUTICASONE PROPIONATE 50 MCG/ACT NA SUSP: 1.0000 | Freq: Two times a day (BID) | NASAL | 0 refills | Status: DC

## 2019-05-09 MED ORDER — OMEPRAZOLE 40 MG PO CPDR: 40.0000 mg | DELAYED_RELEASE_CAPSULE | Freq: Every day | ORAL | 0 refills | Status: DC

## 2019-05-09 NOTE — Telephone Encounter (Signed)
-----   Message from Pleas Koch, NP sent at 05/09/2019  9:58 AM EDT ----- Regarding: Covid-19 Testing Good morning!  This patient has an altered sense of taste/smell as well as mild shortness of breath. No known exposure to Covid-19. Consider for testing.  Thanks! Allie Bossier, NP-C

## 2019-05-09 NOTE — Patient Instructions (Signed)
You will be contacted regarding your Covid-19 test.  Try using Flonase (fluticasone) nasal spray. Instill 1 spray in each nostril twice daily.   Start omeprazole 40 mg daily and take for at least 2-3 weeks.   Please update me in 2 weeks as discussed.  It was a pleasure to see you today!

## 2019-05-09 NOTE — Telephone Encounter (Signed)
Contacted patient.  Scheduled her for COVID 19 test today at [facility] at 12pm.  Patient advised to stay in car and wear a mask.

## 2019-05-09 NOTE — Assessment & Plan Note (Signed)
Unclear etiology but differentials include nasal polyp, GERD, Covid-19, allergic rhinitis. Start with Covid-19 testing.  Increase omeprazole to 40 mg daily. Start Flonase for potential nasal polyp. She will update.  Await results.

## 2019-05-09 NOTE — Progress Notes (Signed)
Subjective:    Patient ID: Vickie Waters, female    DOB: 1966-05-13, 53 y.o.   MRN: [id number]  HPI  Virtual Visit via Video Note  I connected with Vickie Waters on 05/09/19 at  9:20 AM EDT by a video enabled telemedicine application and verified that I am speaking with the correct person using two identifiers.  Location: Patient: Home Provider: Office   I discussed the limitations of evaluation and management by telemedicine and the availability of in person appointments. The patient expressed understanding and agreed to proceed.  History of Present Illness:  Ms. Ratcliffe is a 53 year old female with a history of tobacco abuse, type 2 diabetes, hypertension, anxiety and depression who presents today with a chief complaint of altered taste sensation.   Over the past 3 weeks she's noticed an altered taste/smell. She initially thought that her symptoms were secondary to a lot of heavy duty cleaning as she non intentionally inhaled a few of the cleaning products. She endorses a foul smell and taste in her mouth, also finds that she cannot tolerate certain smells that once never bothered her. For example, she cannot tolerate the smell of hand sanitizer.   She also endorses belching. She cut her topiramate down to 25 mg to see if this was the cause of her altered taste/smell but hasn't noticed any difference and is starting to notice headaches return. She's been taking omeprazole 20 mg daily for the last one week without improvement. She has noticed that milk improves her taste/smell. She has noticed a small growth to the inner right nostril for the past one year and denies pain/erythema/swelling/drainage.  She denies cough, shortness of breath, abdominal pain. She does feel nauseated in the mornings without vomiting.    Observations/Objective:  Alert and oriented. Appears well, not sickly. No distress. Speaking in complete sentences.   Assessment and Plan:  Unclear etiology but  differentials include nasal polyp, GERD, Covid-19, allergic rhinitis. Start with Covid-19 testing.  Increase omeprazole to 40 mg daily. Start Flonase for potential nasal polyp. She will update.  Await results.  Follow Up Instructions:  You will be contacted regarding your Covid-19 test.  Try using Flonase (fluticasone) nasal spray. Instill 1 spray in each nostril twice daily.   Start omeprazole 40 mg daily and take for at least 2-3 weeks.   Please update me in 2 weeks as discussed.  It was a pleasure to see you today!    I discussed the assessment and treatment plan with the patient. The patient was provided an opportunity to ask questions and all were answered. The patient agreed with the plan and demonstrated an understanding of the instructions.   The patient was advised to call back or seek an in-person evaluation if the symptoms worsen or if the condition fails to improve as anticipated.     Pleas Koch, NP    Review of Systems  Constitutional: Negative for fever.  HENT: Negative for congestion, rhinorrhea, sinus pressure and sore throat.        Altered taste/smell, nasal mass  Respiratory: Negative for cough.   Cardiovascular: Negative for chest pain.  Neurological: Positive for headaches.       Past Medical History:  Diagnosis Date  . Allergy   . Asthma   . Depression   . Frequent headaches   . Migraine      Social History   Socioeconomic History  . Marital status: Married    Spouse name: Not on  file  . Number of children: Not on file  . Years of education: Not on file  . Highest education level: Not on file  Occupational History  . Not on file  Social Needs  . Financial resource strain: Not on file  . Food insecurity    Worry: Not on file    Inability: Not on file  . Transportation needs    Medical: Not on file    Non-medical: Not on file  Tobacco Use  . Smoking status: Current Every Day Smoker    Packs/day: 1.50    Types:  Cigarettes    Last attempt to quit: 10/15/2015    Years since quitting: 3.5  . Smokeless tobacco: Never Used  Substance and Sexual Activity  . Alcohol use: Yes    Alcohol/week: 0.0 standard drinks    Comment: social  . Drug use: Not on file  . Sexual activity: Not on file  Lifestyle  . Physical activity    Days per week: Not on file    Minutes per session: Not on file  . Stress: Not on file  Relationships  . Social Herbalist on phone: Not on file    Gets together: Not on file    Attends religious service: Not on file    Active member of club or organization: Not on file    Attends meetings of clubs or organizations: Not on file    Relationship status: Not on file  . Intimate partner violence    Fear of current or ex partner: Not on file    Emotionally abused: Not on file    Physically abused: Not on file    Forced sexual activity: Not on file  Other Topics Concern  . Not on file  Social History Narrative   Married.   2 children   Works as an Scientist, water quality.   Enjoys spending time with her family.    Past Surgical History:  Procedure Laterality Date  . CHOLECYSTECTOMY  2001    Family History  Problem Relation Age of Onset  . Arthritis Mother   . Hypertension Mother   . Hypertension Father   . Diabetes Father   . Mental illness Maternal Grandfather   . Breast cancer Neg Hx     Allergies  Allergen Reactions  . Cephalosporins Hives  . Penicillins Hives    Current Outpatient Medications on File Prior to Visit  Medication Sig Dispense Refill  . Blood Glucose Monitoring Suppl (ONE TOUCH ULTRA MINI) w/Device KIT Use as instructed to test blood sugar 2 times daily 1 each 0  . glucose blood (ONE TOUCH ULTRA TEST) test strip USE ONE STRIP TO CHECK GLUCOSE TWICE DAILY 100 each 1  . Lancet Devices (ONE TOUCH DELICA LANCING DEV) MISC Use as instructed to check blood sugar 2 times daily 100 each 11  . metoprolol succinate (TOPROL-XL) 25 MG 24 hr tablet  TAKE 1 TABLET BY MOUTH ONCE DAILY 90 tablet 0  . ONETOUCH DELICA LANCETS FINE MISC Check blood sugar twice a day and as instructed. Dx E11.9 100 each 11  . PARoxetine (PAXIL) 20 MG tablet TAKE 1 TABLET BY MOUTH ONCE DAILY FOR ANXIETY AND FOR DEPRESSION 90 tablet 1  . topiramate (TOPAMAX) 50 MG tablet TAKE 1 TABLET BY MOUTH AT BEDTIME FOR  MIGRAINE  PREVENTION (Patient not taking: Reported on 05/09/2019) 90 tablet 0   No current facility-administered medications on file prior to visit.     Wt 215 lb (  97.5 kg)   BMI 34.70 kg/m    Objective:   Physical Exam  Constitutional: She is oriented to person, place, and time. She appears well-nourished. She does not have a sickly appearance. She does not appear ill.  Respiratory: Effort normal. No respiratory distress.  Neurological: She is alert and oriented to person, place, and time.  Psychiatric: She has a normal mood and affect.           Assessment & Plan:

## 2019-05-14 LAB — NOVEL CORONAVIRUS, NAA: SARS-CoV-2, NAA: NOT DETECTED

## 2019-05-18 DIAGNOSIS — R432 Parageusia: Secondary | ICD-10-CM

## 2019-05-22 ENCOUNTER — Other Ambulatory Visit: Payer: Self-pay | Admitting: Primary Care

## 2019-05-22 DIAGNOSIS — I10 Essential (primary) hypertension: Secondary | ICD-10-CM

## 2019-05-26 ENCOUNTER — Other Ambulatory Visit: Payer: Self-pay | Admitting: Unknown Physician Specialty

## 2019-05-26 DIAGNOSIS — R430 Anosmia: Secondary | ICD-10-CM

## 2019-06-08 ENCOUNTER — Ambulatory Visit
Admission: RE | Admit: 2019-06-08 | Discharge: 2019-06-08 | Disposition: A | Discharge status: Home / Self Care | Payer: Managed Care, Other (non HMO) | Source: Ambulatory Visit | Attending: Unknown Physician Specialty | Admitting: Unknown Physician Specialty

## 2019-06-08 ENCOUNTER — Other Ambulatory Visit: Payer: Self-pay

## 2019-06-08 DIAGNOSIS — R430 Anosmia: Secondary | ICD-10-CM | POA: Diagnosis not present

## 2019-06-08 LAB — POCT I-STAT CREATININE: Creatinine, Ser: 1.3 mg/dL — ABNORMAL HIGH (ref 0.44–1.00)

## 2019-06-08 MED ORDER — GADOBUTROL 1 MMOL/ML IV SOLN
10.0000 mL | Freq: Once | INTRAVENOUS | Status: AC | PRN
  Administered 2019-06-08: 10 mL via INTRAVENOUS

## 2019-06-09 ENCOUNTER — Other Ambulatory Visit: Payer: Self-pay

## 2019-06-09 ENCOUNTER — Encounter: Payer: Self-pay | Admitting: Oncology

## 2019-06-09 ENCOUNTER — Telehealth: Payer: Self-pay | Admitting: Primary Care

## 2019-06-09 ENCOUNTER — Inpatient Hospital Stay: Payer: Managed Care, Other (non HMO) | Attending: Oncology | Admitting: Oncology

## 2019-06-09 VITALS — BP 129/84 | HR 55 | Temp 97.3°F | Ht 64.5 in | Wt 205.4 lb

## 2019-06-09 DIAGNOSIS — F17210 Nicotine dependence, cigarettes, uncomplicated: Secondary | ICD-10-CM | POA: Diagnosis not present

## 2019-06-09 DIAGNOSIS — Z79899 Other long term (current) drug therapy: Secondary | ICD-10-CM | POA: Diagnosis not present

## 2019-06-09 DIAGNOSIS — R430 Anosmia: Secondary | ICD-10-CM | POA: Diagnosis not present

## 2019-06-09 DIAGNOSIS — G9389 Other specified disorders of brain: Secondary | ICD-10-CM

## 2019-06-09 DIAGNOSIS — F329 Major depressive disorder, single episode, unspecified: Secondary | ICD-10-CM | POA: Diagnosis not present

## 2019-06-09 DIAGNOSIS — G939 Disorder of brain, unspecified: Secondary | ICD-10-CM | POA: Diagnosis present

## 2019-06-09 DIAGNOSIS — G936 Cerebral edema: Secondary | ICD-10-CM | POA: Diagnosis not present

## 2019-06-09 DIAGNOSIS — R51 Headache: Secondary | ICD-10-CM | POA: Diagnosis not present

## 2019-06-09 DIAGNOSIS — J45909 Unspecified asthma, uncomplicated: Secondary | ICD-10-CM

## 2019-06-09 MED ORDER — PREDNISONE 50 MG PO TABS: ORAL_TABLET | ORAL | 1 refills | Status: DC

## 2019-06-09 NOTE — Telephone Encounter (Signed)
Spoke with patient via phone regarding recent MRI.  She is aware of her oncology appointment scheduled for 230 this afternoon.

## 2019-06-09 NOTE — Progress Notes (Signed)
[place]  [phone] [phone]  ID: Sunny Schlein OB: 07-05-1966  MR#: [id number]  QBH#:[id number]  Patient Care Team: Pleas Koch, NP as PCP - General (Nurse Practitioner)  CHIEF COMPLAINT: Right temporal brain mass.  INTERVAL HISTORY: Patient is a 53 year old female who noticed a change in smell and taste over the past several weeks.  She was ultimately evaluated by ENT who ordered a brain MRI revealing a large 3.7 x 2.8 cm hemorrhagic mass in the right temporal lobe.  There is also surrounding edema and mild midline shift.  She otherwise feels well and is asymptomatic.  She has no neurologic complaints.  She denies any recent fevers or illnesses.  She has good appetite and denies weight loss.  She has no chest pain, shortness of breath, cough, or hemoptysis.  She denies any nausea, vomiting, constipation, or diarrhea.  She has no urinary complaints.  Patient otherwise feels well and offers no further specific complaints today.  REVIEW OF SYSTEMS:   Review of Systems  Constitutional: Negative.  Negative for fever, malaise/fatigue and weight loss.  HENT: Negative.  Negative for hearing loss, sinus pain, sore throat and tinnitus.   Eyes: Negative.  Negative for blurred vision and double vision.  Respiratory: Negative.  Negative for cough, hemoptysis and shortness of breath.   Cardiovascular: Negative.  Negative for chest pain and leg swelling.  Gastrointestinal: Negative.  Negative for abdominal pain, blood in stool and melena.  Genitourinary: Negative.  Negative for dysuria and hematuria.  Musculoskeletal: Negative.  Negative for back pain and neck pain.  Skin: Negative.  Negative for rash.  Neurological: Negative.  Negative for dizziness, sensory change, speech change, focal weakness, seizures, weakness and headaches.  Psychiatric/Behavioral: The patient is nervous/anxious.     As per HPI. Otherwise, a complete review of systems is  negative.  PAST MEDICAL HISTORY: Past Medical History:  Diagnosis Date  . Allergy   . Asthma   . Depression   . Frequent headaches   . Migraine     PAST SURGICAL HISTORY: Past Surgical History:  Procedure Laterality Date  . CHOLECYSTECTOMY  2001    FAMILY HISTORY: Family History  Problem Relation Age of Onset  . Arthritis Mother   . Hypertension Mother   . Hypertension Father   . Diabetes Father   . Mental illness Maternal Grandfather   . Breast cancer Neg Hx     ADVANCED DIRECTIVES (Y/N):  N  HEALTH MAINTENANCE: Social History   Tobacco Use  . Smoking status: Current Every Day Smoker    Packs/day: 2.00    Types: Cigarettes    Last attempt to quit: 10/15/2015    Years since quitting: 3.6  . Smokeless tobacco: Never Used  Substance Use Topics  . Alcohol use: Yes    Alcohol/week: 0.0 standard drinks    Comment: social  . Drug use: Not on file     Colonoscopy:  PAP:  Bone density:  Lipid panel:  Allergies  Allergen Reactions  . Cephalosporins Hives  . Penicillins Hives    Current Outpatient Medications  Medication Sig Dispense Refill  . Blood Glucose Monitoring Suppl (ONE TOUCH ULTRA MINI) w/Device KIT Use as instructed to test blood sugar 2 times daily 1 each 0  . glucose blood (ONE TOUCH ULTRA TEST) test strip USE ONE STRIP TO CHECK GLUCOSE TWICE DAILY 100 each 1  . Lancet Devices (ONE TOUCH DELICA LANCING DEV) MISC Use as instructed to check blood sugar 2 times  daily 100 each 11  . metoprolol succinate (TOPROL-XL) 25 MG 24 hr tablet Take 1 tablet by mouth once daily 90 tablet 1  . omeprazole (PRILOSEC) 40 MG capsule Take 1 capsule (40 mg total) by mouth daily. For acid reflux. 30 capsule 0  . ONETOUCH DELICA LANCETS FINE MISC Check blood sugar twice a day and as instructed. Dx E11.9 100 each 11  . PARoxetine (PAXIL) 20 MG tablet TAKE 1 TABLET BY MOUTH ONCE DAILY FOR ANXIETY AND FOR DEPRESSION 90 tablet 1  . fluticasone (FLONASE) 50 MCG/ACT nasal  spray Place 1 spray into both nostrils 2 (two) times daily. (Patient not taking: Reported on 06/09/2019) 16 g 0   No current facility-administered medications for this visit.     OBJECTIVE: Vitals:   06/09/19 1437  BP: 129/84  Pulse: (!) 55  Temp: (!) 97.3 F (36.3 C)     Body mass index is 34.71 kg/m.    ECOG FS:0 - Asymptomatic  General: Well-developed, well-nourished, no acute distress. Eyes: Pink conjunctiva, anicteric sclera. HEENT: Normocephalic, moist mucous membranes, clear oropharnyx. Lungs: Clear to auscultation bilaterally. Heart: Regular rate and rhythm. No rubs, murmurs, or gallops. Abdomen: Soft, nontender, nondistended. No organomegaly noted, normoactive bowel sounds. Musculoskeletal: No edema, cyanosis, or clubbing. Neuro: Alert, answering all questions appropriately. Cranial nerves grossly intact. Skin: No rashes or petechiae noted. Psych: Normal affect. Lymphatics: No cervical, calvicular, axillary or inguinal LAD.   LAB RESULTS:  Lab Results  Component Value Date   NA 138 10/19/2018   K 4.3 10/19/2018   CL 107 10/19/2018   CO2 24 10/19/2018   GLUCOSE 104 (H) 10/19/2018   BUN 10 10/19/2018   CREATININE 1.30 (H) 06/08/2019   CALCIUM 9.5 10/19/2018   PROT 7.5 10/19/2018   ALBUMIN 4.2 10/19/2018   AST 19 10/19/2018   ALT 24 10/19/2018   ALKPHOS 98 10/19/2018   BILITOT 0.6 10/19/2018   GFRNONAA >60 05/10/2017   GFRAA >60 05/10/2017    Lab Results  Component Value Date   WBC 10.3 05/10/2017   HGB 14.8 05/10/2017   HCT 43.2 05/10/2017   MCV 89.5 05/10/2017   PLT 234 05/10/2017     STUDIES: [name] TD Contrast  Result Date: 06/08/2019 CLINICAL DATA:  Anosmia EXAM: MRI HEAD WITHOUT AND WITH CONTRAST TECHNIQUE: Multiplanar, multiecho pulse sequences of the brain and surrounding structures were obtained without and with intravenous contrast. CONTRAST:  10 mL Gadovist IV COMPARISON:  CT head 05/11/2017 FINDINGS: Brain: Enhancing mass in the  right temporal lobe measures 3.7 x 2.8 cm. Relatively homogeneous enhancement with internal hemorrhage. Mild amount of surrounding vasogenic edema in the white matter with local mass-effect. Mild midline shift to the left approximately 3 mm. The mass extends to the greater wing of the sphenoid. It is difficult to determine if this is intra or extra-axial however there appears to be posterior displacement of temporal cortex suggesting the mass is extra-axial. There is extensive enhancement within the sylvian fissure on the right as well as patchy enhancement in the subarachnoid space bilaterally and diffusely including in the posterior fossa. There is enhancement in the internal auditory canal bilaterally. Olfactory nerve protocol was performed. No mass along the course of the olfactory nerve. Cannot exclude leptomeningeal spread of tumor along the L factory nerve. Olfactory recess normal bilaterally. There is extra-axial enhancement in the interhemispheric fissure anteriorly as well as diffusely throughout both cerebral hemispheres and posterior fossa. Ethmoid sinuses clear without mass lesion. Ventricle size normal.  Negative for acute infarct. Vascular: Normal arterial flow voids Skull and upper cervical spine: No suspicious bone lesions. Sinuses/Orbits: Paranasal sinuses clear bilaterally. Other: None IMPRESSION: 3.7 x 2.8 cm hemorrhagic mass right temporal lobe. The lesion is possibly extra-axial. There is surrounding edema and mild midline shift. There is extensive enhancement throughout the subarachnoid space bilaterally and in the posterior fossa, most severe in the sylvian fissure. The findings are most consistent with tumor. 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 The dominant lesion in the right temporal lobe may be a metastatic deposit or possibly a primary tumor which has spread in the subarachnoid space. These results were called by telephone at the time of interpretation  on 06/08/2019 at 5:25 pm to Dr. Richardson Landry , who verbally acknowledged these results. Electronically Signed   By: Franchot Gallo M.D.   On: 06/08/2019 17:28    ASSESSMENT: Right temporal brain mass.  PLAN:    1. Right temporal brain mass: MRI of the brain reviewed independently and reported as above with a 3.7 x 2.8 cm hemorrhagic mass in the right temporal lobe.  There is also surrounding edema with mild midline shift.  Case discussed with both neurosurgery and ENT.  This does not appear to be a primary brain lesion, but rather metastasis.  Will get a stat CT of the chest, abdomen, and pelvis to assess for primary.  Patient will ultimately require a biopsy to determine the diagnosis.  Patient will return to clinic tomorrow to discuss her CT results and potential biopsy.  Patient will also have initial consult with radiation oncology tomorrow. 2.  Intracranial edema: Patient was given a prescription for 75 mg prednisone daily. 3.  Anosmia: Patient COVID-19 negative.  Possibly as a result of brain mass as above.  I spent a total of 60 minutes face-to-face with the patient of which greater than 50% of the visit was spent in counseling and coordination of care as detailed above.  Patient expressed understanding and was in agreement with this plan. She also understands that She can call clinic at any time with any questions, concerns, or complaints.   Cancer Staging No matching staging information was found for the patient.  Lloyd Huger, MD   06/09/2019 5:58 PM

## 2019-06-09 NOTE — Telephone Encounter (Signed)
Pt called regarding results she received from 7/22 MRI. She has 2:30 pm appt today with oncology and she would like to speak with Anda Kraft prior to this appt, but understands if Anda Kraft is not available until afterwards.

## 2019-06-09 NOTE — Progress Notes (Signed)
Patient is here today to establish care for brain masses. Patient stated that it all started by her sense of smell being really strong for 2 months.

## 2019-06-10 ENCOUNTER — Ambulatory Visit
Admission: RE | Admit: 2019-06-10 | Discharge: 2019-06-10 | Disposition: A | Discharge status: Home / Self Care | Payer: Managed Care, Other (non HMO) | Source: Ambulatory Visit | Attending: Oncology | Admitting: Oncology

## 2019-06-10 ENCOUNTER — Ambulatory Visit
Admission: RE | Admit: 2019-06-10 | Discharge: 2019-06-10 | Disposition: A | Discharge status: Home / Self Care | Payer: Managed Care, Other (non HMO) | Source: Ambulatory Visit | Attending: Radiation Oncology | Admitting: Radiation Oncology

## 2019-06-10 ENCOUNTER — Other Ambulatory Visit: Payer: Self-pay

## 2019-06-10 ENCOUNTER — Inpatient Hospital Stay (HOSPITAL_BASED_OUTPATIENT_CLINIC_OR_DEPARTMENT_OTHER): Payer: Managed Care, Other (non HMO) | Admitting: Oncology

## 2019-06-10 ENCOUNTER — Encounter: Payer: Self-pay | Admitting: Radiation Oncology

## 2019-06-10 VITALS — BP 157/93 | HR 74 | Temp 97.1°F | Resp 18 | Wt 204.8 lb

## 2019-06-10 DIAGNOSIS — F17210 Nicotine dependence, cigarettes, uncomplicated: Secondary | ICD-10-CM

## 2019-06-10 DIAGNOSIS — G936 Cerebral edema: Secondary | ICD-10-CM | POA: Diagnosis not present

## 2019-06-10 DIAGNOSIS — G939 Disorder of brain, unspecified: Secondary | ICD-10-CM

## 2019-06-10 DIAGNOSIS — G9389 Other specified disorders of brain: Secondary | ICD-10-CM

## 2019-06-10 DIAGNOSIS — F329 Major depressive disorder, single episode, unspecified: Secondary | ICD-10-CM

## 2019-06-10 DIAGNOSIS — R430 Anosmia: Secondary | ICD-10-CM

## 2019-06-10 DIAGNOSIS — R51 Headache: Secondary | ICD-10-CM | POA: Diagnosis not present

## 2019-06-10 DIAGNOSIS — Z79899 Other long term (current) drug therapy: Secondary | ICD-10-CM

## 2019-06-10 DIAGNOSIS — J45909 Unspecified asthma, uncomplicated: Secondary | ICD-10-CM

## 2019-06-10 HISTORY — DX: Essential (primary) hypertension: I10

## 2019-06-10 MED ORDER — IOHEXOL 300 MG/ML  SOLN
100.0000 mL | Freq: Once | INTRAMUSCULAR | Status: AC | PRN
  Administered 2019-06-10: 08:00:00 100 mL via INTRAVENOUS

## 2019-06-10 NOTE — Consult Note (Signed)
NEW PATIENT EVALUATION  Name: Vickie Waters  MRN: [id number]  Date:   06/10/2019     DOB: [birth year]   This 53 y.o. female patient presents to the clinic for initial evaluation of unusual solitary brain lesion and 53 year old female as of now not biopsied.  REFERRING PHYSICIAN: Pleas Koch, NP  CHIEF COMPLAINT: No chief complaint on file.   DIAGNOSIS: The encounter diagnosis was Brain mass.   PREVIOUS INVESTIGATIONS:  MRI scans reviewed CT scans chest abdomen pelvis reviewed Clinical notes reviewed  HPI: Patient is a 53 year old female with an unusual history of subtle neurologic complaints including some floaters on vision as well as right frontal headaches over the course of the past year.  She was seen by ENT who ordered a brain MRI showing a 3.7 x 2.8 cm hemorrhagic mass in the right temporal lobe.  There is surrounding edema and midline shift.  Interestingly there is also enhancement throughout the subarachnoid space bilaterally which may represent leptomeningeal spread of tumor.  She had a CT scan of chest abdomen pelvis showing no evidence of primary disease there is a right renal cyst which on my review does appear to be benign.  She specifically denies bone pain cough hemoptysis chest tightness.  She is having no focal neurologic deficits.  PLANNED TREATMENT REGIMEN: Brain biopsy  PAST MEDICAL HISTORY:  has a past medical history of Allergy, Asthma, Depression, Frequent headaches, Hypertension, and Migraine.    PAST SURGICAL HISTORY:  Past Surgical History:  Procedure Laterality Date  . CHOLECYSTECTOMY  2001    FAMILY HISTORY: family history includes Arthritis in her mother; Diabetes in her father; Hypertension in her father and mother; Mental illness in her maternal grandfather.  SOCIAL HISTORY:  reports that she has been smoking cigarettes. She has been smoking about 2.00 packs per day. She has never used smokeless tobacco. She reports current alcohol  use.  ALLERGIES: Cephalosporins and Penicillins  MEDICATIONS:  Current Outpatient Medications  Medication Sig Dispense Refill  . Blood Glucose Monitoring Suppl (ONE TOUCH ULTRA MINI) w/Device KIT Use as instructed to test blood sugar 2 times daily 1 each 0  . fluticasone (FLONASE) 50 MCG/ACT nasal spray Place 1 spray into both nostrils 2 (two) times daily. (Patient not taking: Reported on 06/09/2019) 16 g 0  . glucose blood (ONE TOUCH ULTRA TEST) test strip USE ONE STRIP TO CHECK GLUCOSE TWICE DAILY 100 each 1  . Lancet Devices (ONE TOUCH DELICA LANCING DEV) MISC Use as instructed to check blood sugar 2 times daily 100 each 11  . metoprolol succinate (TOPROL-XL) 25 MG 24 hr tablet Take 1 tablet by mouth once daily 90 tablet 1  . omeprazole (PRILOSEC) 40 MG capsule Take 1 capsule (40 mg total) by mouth daily. For acid reflux. 30 capsule 0  . ONETOUCH DELICA LANCETS FINE MISC Check blood sugar twice a day and as instructed. Dx E11.9 100 each 11  . PARoxetine (PAXIL) 20 MG tablet TAKE 1 TABLET BY MOUTH ONCE DAILY FOR ANXIETY AND FOR DEPRESSION 90 tablet 1  . predniSONE (DELTASONE) 50 MG tablet Take 1.5 tablets daily or 75 mg. 45 tablet 1   No current facility-administered medications for this encounter.     ECOG PERFORMANCE STATUS:    REVIEW OF SYSTEMS: Patient denies any weight loss, fatigue, weakness, fever, chills or night sweats. Patient denies any loss of vision, blurred vision. Patient denies any ringing  of the ears or hearing loss. No irregular heartbeat. Patient denies heart murmur  or history of fainting. Patient denies any chest pain or pain radiating to her upper extremities. Patient denies any shortness of breath, difficulty breathing at night, cough or hemoptysis. Patient denies any swelling in the lower legs. Patient denies any nausea vomiting, vomiting of blood, or coffee ground material in the vomitus. Patient denies any stomach pain. Patient states has had normal bowel movements  no significant constipation or diarrhea. Patient denies any dysuria, hematuria or significant nocturia. Patient denies any problems walking, swelling in the joints or loss of balance. Patient denies any skin changes, loss of hair or loss of weight. Patient denies any excessive worrying or anxiety or significant depression. Patient denies any problems with insomnia. Patient denies excessive thirst, polyuria, polydipsia. Patient denies any swollen glands, patient denies easy bruising or easy bleeding. Patient denies any recent infections, allergies or URI. Patient "s visual fields have not changed significantly in recent time.   PHYSICAL EXAM: BP (!) 157/93   Pulse 74   Temp (!) 97.1 F (36.2 C)   Resp 18   Wt 204 lb 12.9 oz (92.9 kg)   BMI 34.61 kg/m  Crude visual fields within normal range motor or sensory and DTR levels are equal symmetric in the upper lower extremities.  Proprioception is intact.  Well-developed well-nourished patient in NAD. HEENT reveals PERLA, EOMI, discs not visualized.  Oral cavity is clear. No oral mucosal lesions are identified. Neck is clear without evidence of cervical or supraclavicular adenopathy. Lungs are clear to A&P. Cardiac examination is essentially unremarkable with regular rate and rhythm without murmur rub or thrill. Abdomen is benign with no organomegaly or masses noted. Motor sensory and DTR levels are equal and symmetric in the upper and lower extremities. Cranial nerves II through XII are grossly intact. Proprioception is intact. No peripheral adenopathy or edema is identified. No motor or sensory levels are noted. Crude visual fields are within normal range.  LABORATORY DATA: No clinical pathology patient is scheduled for brain biopsy through [facility]    RADIOLOGY RESULTS: MRI of brain CT scans of chest abdomen pelvis all reviewed and compatible with above-stated findings   IMPRESSION: Solitary lesion of the right temporal lobe in 53 year old  female for evaluation by [facility] for biopsy  PLAN: At this time will await her biopsy results.  I discussed case personally with Dr. Grayland Ormond.  We will see the patient in follow-up after her brain biopsy.  Certainly this can be a primary brain tumor versus metastatic disease of unknown primary.  We will wait pathology for further treatment recommendations.  I spoke with her husband during my interview.  They all seem to comprehend our treatment plan well.  I would like to take this opportunity to thank you for allowing me to participate in the care of your patient.Noreene Filbert, MD

## 2019-06-12 NOTE — Progress Notes (Addendum)
[place]  [phone] [phone]  ID: Vickie Waters OB: 06-05-1966  MR#: [id number]  OHY#:[id number]  Patient Care Team: Pleas Koch, NP as PCP - General (Nurse Practitioner)  CHIEF COMPLAINT: Right temporal brain mass.  INTERVAL HISTORY: Patient returns to clinic today for further evaluation and discussion of her imaging results.  She had consultation with radiation oncology earlier today.  She currently feels well and is asymptomatic.  She has no neurologic complaints.  She denies any recent fevers or illnesses.  She has a good appetite and denies weight loss.  She has no chest pain, shortness of breath, cough, or hemoptysis.  She denies any nausea, vomiting, constipation, or diarrhea.  She has no urinary complaints.  Patient feels at her baseline offers no specific complaints today.  REVIEW OF SYSTEMS:   Review of Systems  Constitutional: Negative.  Negative for fever, malaise/fatigue and weight loss.  HENT: Negative.  Negative for hearing loss, sinus pain, sore throat and tinnitus.   Eyes: Negative.  Negative for blurred vision and double vision.  Respiratory: Negative.  Negative for cough, hemoptysis and shortness of breath.   Cardiovascular: Negative.  Negative for chest pain and leg swelling.  Gastrointestinal: Negative.  Negative for abdominal pain, blood in stool and melena.  Genitourinary: Negative.  Negative for dysuria and hematuria.  Musculoskeletal: Negative.  Negative for back pain and neck pain.  Skin: Negative.  Negative for rash.  Neurological: Negative.  Negative for dizziness, sensory change, speech change, focal weakness, seizures, weakness and headaches.  Psychiatric/Behavioral: The patient is nervous/anxious.     As per HPI. Otherwise, a complete review of systems is negative.  PAST MEDICAL HISTORY: Past Medical History:  Diagnosis Date   Allergy    Asthma    Depression    Frequent headaches     Hypertension    Migraine     PAST SURGICAL HISTORY: Past Surgical History:  Procedure Laterality Date   CHOLECYSTECTOMY  2001    FAMILY HISTORY: Family History  Problem Relation Age of Onset   Arthritis Mother    Hypertension Mother    Hypertension Father    Diabetes Father    Mental illness Maternal Grandfather    Breast cancer Neg Hx     ADVANCED DIRECTIVES (Y/N):  N  HEALTH MAINTENANCE: Social History   Tobacco Use   Smoking status: Current Every Day Smoker    Packs/day: 2.00    Types: Cigarettes    Last attempt to quit: 10/15/2015    Years since quitting: 3.6   Smokeless tobacco: Never Used  Substance Use Topics   Alcohol use: Yes    Alcohol/week: 0.0 standard drinks    Comment: social   Drug use: Not on file     Colonoscopy:  PAP:  Bone density:  Lipid panel:  Allergies  Allergen Reactions   Cephalosporins Hives   Penicillins Hives    Current Outpatient Medications  Medication Sig Dispense Refill   Blood Glucose Monitoring Suppl (ONE TOUCH ULTRA MINI) w/Device KIT Use as instructed to test blood sugar 2 times daily 1 each 0   fluticasone (FLONASE) 50 MCG/ACT nasal spray Place 1 spray into both nostrils 2 (two) times daily. (Patient not taking: Reported on 06/09/2019) 16 g 0   glucose blood (ONE TOUCH ULTRA TEST) test strip USE ONE STRIP TO CHECK GLUCOSE TWICE DAILY 100 each 1   Lancet Devices (ONE TOUCH DELICA LANCING DEV) MISC Use as instructed to check blood sugar 2 times  daily 100 each 11   metoprolol succinate (TOPROL-XL) 25 MG 24 hr tablet Take 1 tablet by mouth once daily 90 tablet 1   omeprazole (PRILOSEC) 40 MG capsule Take 1 capsule (40 mg total) by mouth daily. For acid reflux. 30 capsule 0   ONETOUCH DELICA LANCETS FINE MISC Check blood sugar twice a day and as instructed. Dx E11.9 100 each 11   PARoxetine (PAXIL) 20 MG tablet TAKE 1 TABLET BY MOUTH ONCE DAILY FOR ANXIETY AND FOR DEPRESSION 90 tablet 1   predniSONE  (DELTASONE) 50 MG tablet Take 1.5 tablets daily or 75 mg. 45 tablet 1   No current facility-administered medications for this visit.     OBJECTIVE: There were no vitals filed for this visit.   There is no height or weight on file to calculate BMI.    ECOG FS:0 - Asymptomatic  General: Well-developed, well-nourished, no acute distress. Eyes: Pink conjunctiva, anicteric sclera. HEENT: Normocephalic, moist mucous membranes. Lungs: Clear to auscultation bilaterally. Heart: Regular rate and rhythm. No rubs, murmurs, or gallops. Abdomen: Soft, nontender, nondistended. No organomegaly noted, normoactive bowel sounds. Musculoskeletal: No edema, cyanosis, or clubbing. Neuro: Alert, answering all questions appropriately. Cranial nerves grossly intact. Skin: No rashes or petechiae noted. Psych: Normal affect.   LAB RESULTS:  Lab Results  Component Value Date   NA 138 10/19/2018   K 4.3 10/19/2018   CL 107 10/19/2018   CO2 24 10/19/2018   GLUCOSE 104 (H) 10/19/2018   BUN 10 10/19/2018   CREATININE 1.30 (H) 06/08/2019   CALCIUM 9.5 10/19/2018   PROT 7.5 10/19/2018   ALBUMIN 4.2 10/19/2018   AST 19 10/19/2018   ALT 24 10/19/2018   ALKPHOS 98 10/19/2018   BILITOT 0.6 10/19/2018   GFRNONAA >60 05/10/2017   GFRAA >60 05/10/2017    Lab Results  Component Value Date   WBC 10.3 05/10/2017   HGB 14.8 05/10/2017   HCT 43.2 05/10/2017   MCV 89.5 05/10/2017   PLT 234 05/10/2017     STUDIES: Ct Chest W Contrast  Result Date: 06/10/2019 CLINICAL DATA:  Patient with new diagnosis of a right temporal brain lesion. Evaluate for possible primary neoplasm. EXAM: CT CHEST, ABDOMEN, AND PELVIS WITH CONTRAST TECHNIQUE: Multidetector CT imaging of the chest, abdomen and pelvis was performed following the standard protocol during bolus administration of intravenous contrast. CONTRAST:  159m OMNIPAQUE IOHEXOL 300 MG/ML  SOLN COMPARISON:  None. FINDINGS: CT CHEST FINDINGS Cardiovascular: The heart  is normal in size. No pericardial effusion. The aorta is normal in caliber. No dissection. No atherosclerotic calcifications. The pulmonary arteries appear normal. Mediastinum/Nodes: No mediastinal or hilar mass. Scattered mediastinal lymph nodes, likely benign. 8 mm pretracheal lymph node on image number 22. Small sub 8 mm AP window nodes on image number 21. Lungs/Pleura: Mild emphysematous changes are noted. No worrisome pulmonary lesions to suggest a primary lung neoplasm or pulmonary nodules to suggest metastatic disease. No pleural effusion or pleural nodules. Musculoskeletal: No breast masses, supraclavicular or axillary adenopathy. Small scattered axillary lymph nodes are noted bilaterally with benign appearing fatty hila. The thyroid gland appears normal. No worrisome bone lesions.  No evidence of metastatic bone disease. CT ABDOMEN PELVIS FINDINGS Hepatobiliary: No focal hepatic lesions or intrahepatic biliary dilatation. The gallbladder is surgically absent. No common bile duct dilatation. Pancreas: No mass, inflammation or ductal dilatation. Spleen: Normal size.  No focal lesions. Adrenals/Urinary Tract: The adrenal glands are unremarkable. There is a 16 mm low-attenuation lesion in the interpolar region  of the right kidney. This measures 24 Hounsfield units and is probably a benign cyst with partial volume averaging or it contains a small amount of hemorrhage or proteinaceous debris. No hydronephrosis. The bladder is unremarkable. Stomach/Bowel: The stomach, duodenum, small bowel and colon are unremarkable. No acute inflammatory changes, mass lesions or obstructive findings. The terminal ileum is normal. The appendix is normal. Vascular/Lymphatic: The aorta is normal in caliber. No dissection. The branch vessels are patent. The major venous structures are patent. No mesenteric or retroperitoneal mass or adenopathy. Small scattered lymph nodes are noted. Reproductive: The uterus and ovaries are normal.  Other: No pelvic mass or adenopathy. No free pelvic fluid collections. No inguinal mass or adenopathy. No abdominal wall hernia or subcutaneous lesions. Musculoskeletal: No significant bony findings. No worrisome bone lesions. Age advanced bilateral hip joint degenerative changes and lower lumbar facet disease. IMPRESSION: 1. No CT findings to suggest a primary neoplasm in the chest, abdomen or pelvis. 2. No acute findings in the chest, abdomen or pelvis. 3. Probable benign, slightly complex right renal cyst. I would recommend a follow-up renal ultrasound examination in 6 months to document stability. Electronically Signed   By: Marijo Sanes M.D.   On: 06/10/2019 08:51   [name] SN Contrast  Result Date: 06/08/2019 CLINICAL DATA:  Anosmia EXAM: MRI HEAD WITHOUT AND WITH CONTRAST TECHNIQUE: Multiplanar, multiecho pulse sequences of the brain and surrounding structures were obtained without and with intravenous contrast. CONTRAST:  10 mL Gadovist IV COMPARISON:  CT head 05/11/2017 FINDINGS: Brain: Enhancing mass in the right temporal lobe measures 3.7 x 2.8 cm. Relatively homogeneous enhancement with internal hemorrhage. Mild amount of surrounding vasogenic edema in the white matter with local mass-effect. Mild midline shift to the left approximately 3 mm. The mass extends to the greater wing of the sphenoid. It is difficult to determine if this is intra or extra-axial however there appears to be posterior displacement of temporal cortex suggesting the mass is extra-axial. There is extensive enhancement within the sylvian fissure on the right as well as patchy enhancement in the subarachnoid space bilaterally and diffusely including in the posterior fossa. There is enhancement in the internal auditory canal bilaterally. Olfactory nerve protocol was performed. No mass along the course of the olfactory nerve. Cannot exclude leptomeningeal spread of tumor along the L factory nerve. Olfactory recess normal  bilaterally. There is extra-axial enhancement in the interhemispheric fissure anteriorly as well as diffusely throughout both cerebral hemispheres and posterior fossa. Ethmoid sinuses clear without mass lesion. Ventricle size normal.  Negative for acute infarct. Vascular: Normal arterial flow voids Skull and upper cervical spine: No suspicious bone lesions. Sinuses/Orbits: Paranasal sinuses clear bilaterally. Other: None IMPRESSION: 3.7 x 2.8 cm hemorrhagic mass right temporal lobe. The lesion is possibly extra-axial. There is surrounding edema and mild midline shift. There is extensive enhancement throughout the subarachnoid space bilaterally and in the posterior fossa, most severe in the sylvian fissure. The findings are most consistent with tumor. 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 The dominant lesion in the right temporal lobe may be a metastatic deposit or possibly a primary tumor which has spread in the subarachnoid space. These results were called by telephone at the time of interpretation on 06/08/2019 at 5:25 pm to Dr. Richardson Landry , who verbally acknowledged these results. Electronically Signed   By: Franchot Gallo M.D.   On: 06/08/2019 17:28   Ct Abdomen Pelvis W Contrast  Result Date: 06/10/2019 CLINICAL DATA:  Patient with new diagnosis of a right temporal brain lesion. Evaluate for possible primary neoplasm. EXAM: CT CHEST, ABDOMEN, AND PELVIS WITH CONTRAST TECHNIQUE: Multidetector CT imaging of the chest, abdomen and pelvis was performed following the standard protocol during bolus administration of intravenous contrast. CONTRAST:  146m OMNIPAQUE IOHEXOL 300 MG/ML  SOLN COMPARISON:  None. FINDINGS: CT CHEST FINDINGS Cardiovascular: The heart is normal in size. No pericardial effusion. The aorta is normal in caliber. No dissection. No atherosclerotic calcifications. The pulmonary arteries appear normal. Mediastinum/Nodes: No mediastinal or hilar mass. Scattered  mediastinal lymph nodes, likely benign. 8 mm pretracheal lymph node on image number 22. Small sub 8 mm AP window nodes on image number 21. Lungs/Pleura: Mild emphysematous changes are noted. No worrisome pulmonary lesions to suggest a primary lung neoplasm or pulmonary nodules to suggest metastatic disease. No pleural effusion or pleural nodules. Musculoskeletal: No breast masses, supraclavicular or axillary adenopathy. Small scattered axillary lymph nodes are noted bilaterally with benign appearing fatty hila. The thyroid gland appears normal. No worrisome bone lesions.  No evidence of metastatic bone disease. CT ABDOMEN PELVIS FINDINGS Hepatobiliary: No focal hepatic lesions or intrahepatic biliary dilatation. The gallbladder is surgically absent. No common bile duct dilatation. Pancreas: No mass, inflammation or ductal dilatation. Spleen: Normal size.  No focal lesions. Adrenals/Urinary Tract: The adrenal glands are unremarkable. There is a 16 mm low-attenuation lesion in the interpolar region of the right kidney. This measures 24 Hounsfield units and is probably a benign cyst with partial volume averaging or it contains a small amount of hemorrhage or proteinaceous debris. No hydronephrosis. The bladder is unremarkable. Stomach/Bowel: The stomach, duodenum, small bowel and colon are unremarkable. No acute inflammatory changes, mass lesions or obstructive findings. The terminal ileum is normal. The appendix is normal. Vascular/Lymphatic: The aorta is normal in caliber. No dissection. The branch vessels are patent. The major venous structures are patent. No mesenteric or retroperitoneal mass or adenopathy. Small scattered lymph nodes are noted. Reproductive: The uterus and ovaries are normal. Other: No pelvic mass or adenopathy. No free pelvic fluid collections. No inguinal mass or adenopathy. No abdominal wall hernia or subcutaneous lesions. Musculoskeletal: No significant bony findings. No worrisome bone  lesions. Age advanced bilateral hip joint degenerative changes and lower lumbar facet disease. IMPRESSION: 1. No CT findings to suggest a primary neoplasm in the chest, abdomen or pelvis. 2. No acute findings in the chest, abdomen or pelvis. 3. Probable benign, slightly complex right renal cyst. I would recommend a follow-up renal ultrasound examination in 6 months to document stability. Electronically Signed   By: PMarijo SanesM.D.   On: 06/10/2019 08:51    ASSESSMENT: Right temporal brain mass.  PLAN:    1. Right temporal brain mass: MRI of the brain reviewed independently and reported as above with a 3.7 x 2.8 cm hemorrhagic mass in the right temporal lobe.  There is also surrounding edema with mild midline shift.  Case discussed with both neurosurgery and ENT.  CT scanning of the chest, abdomen, and pelvis did not reveal any distinct pathology possibly indicating this is a primary brain lesion.  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  Given the appearance of leptomeningeal spread on her MRI, will attempt to get a stat MRI of her neck on Monday with follow-up with neurosurgery on Tuesday.  Can also consider an LP for further evaluation.  Follow-up after neurosurgical biopsy to discuss the results and treatment planning. 2.  Intracranial edema: Continue 75 mg prednisone daily. 3.  Anosmia: Patient  COVID-19 negative.  Possibly as a result of brain mass as above.  I spent a total of 30 minutes face-to-face with the patient of which greater than 50% of the visit was spent in counseling and coordination of care as detailed above.   Patient expressed understanding and was in agreement with this plan. She also understands that She can call clinic at any time with any questions, concerns, or complaints.   Cancer Staging No matching staging information was found for the patient.  Lloyd Huger, MD   06/12/2019 8:16 AM  Addendum: Patient underwent open brain biopsy at [facility] on June 30, 2019.  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  Plan to get PET scan to assess for primary lesion.  Patient will follow-up with radiation oncology for definitive treatment of her brain lesion as well as medical oncology for consideration of systemic treatment.  Lloyd Huger, MD 07/14/19 5:11 PM

## 2019-06-13 ENCOUNTER — Other Ambulatory Visit: Payer: Self-pay | Admitting: Oncology

## 2019-06-13 ENCOUNTER — Ambulatory Visit
Admission: RE | Admit: 2019-06-13 | Discharge: 2019-06-13 | Disposition: A | Discharge status: Home / Self Care | Payer: Managed Care, Other (non HMO) | Source: Ambulatory Visit | Attending: Oncology | Admitting: Oncology

## 2019-06-13 ENCOUNTER — Other Ambulatory Visit: Payer: Self-pay

## 2019-06-13 DIAGNOSIS — G9389 Other specified disorders of brain: Secondary | ICD-10-CM | POA: Diagnosis present

## 2019-06-13 MED ORDER — GADOBUTROL 1 MMOL/ML IV SOLN
9.0000 mL | Freq: Once | INTRAVENOUS | Status: AC | PRN
  Administered 2019-06-13: 11:00:00 9 mL via INTRAVENOUS

## 2019-06-13 NOTE — Progress Notes (Unsigned)
thoraic

## 2019-06-24 NOTE — Telephone Encounter (Signed)
Pt called to let you know spinal tap showed normal #s, no malignancy cells. Thurs she will have brain biopsy. She wanted to share her joy!

## 2019-07-12 ENCOUNTER — Other Ambulatory Visit: Payer: Self-pay | Admitting: Oncology

## 2019-07-12 DIAGNOSIS — C439 Malignant melanoma of skin, unspecified: Secondary | ICD-10-CM

## 2019-07-14 ENCOUNTER — Other Ambulatory Visit: Payer: Managed Care, Other (non HMO)

## 2019-07-14 ENCOUNTER — Other Ambulatory Visit: Payer: Self-pay | Admitting: Primary Care

## 2019-07-14 ENCOUNTER — Other Ambulatory Visit: Payer: Self-pay

## 2019-07-14 ENCOUNTER — Ambulatory Visit: Payer: Managed Care, Other (non HMO) | Admitting: Primary Care

## 2019-07-14 NOTE — Progress Notes (Addendum)
Tumor Board Documentation  Vickie Waters was presented by Dr Grayland Ormond at our Tumor Board on 07/14/2019, which included representatives from medical oncology, radiation oncology, pathology, radiology, surgical, surgical oncology, navigation, internal medicine, pharmacy, genetics, pulmonology, palliative care, research.  Vickie Waters currently presents as a current patient, for discussion, for new positive pathology with history of the following treatments: active survellience, surgical intervention(s).  Additionally, we reviewed previous medical and familial history, history of present illness, and recent lab results along with all available histopathologic and imaging studies. The tumor board considered available treatment options and made the following recommendations:  PET for further staging, XRT to brain.  IF BRAF negative, consider combination immunotherapy.   The following procedures/referrals were also placed: No orders of the defined types were placed in this encounter.   Clinical Trial Status: not discussed   Staging used: AJCC Stage Group  AJCC Staging:       Group: Stage 4 Malignant Melanoma  National site-specific guidelines   were discussed with respect to the case.  Tumor board is a meeting of clinicians from various specialty areas who evaluate and discuss patients for whom a multidisciplinary approach is being considered. Final determinations in the plan of care are those of the provider(s). The responsibility for follow up of recommendations given during tumor board is that of the provider.   Today's extended care, comprehensive team conference, Vickie Waters was not present for the discussion and was not examined.   Multidisciplinary Tumor Board is a multidisciplinary case peer review process.  Decisions discussed in the Multidisciplinary Tumor Board reflect the opinions of the specialists present at the conference without having examined the patient.  Ultimately, treatment and  diagnostic decisions rest with the primary provider(s) and the patient.

## 2019-07-15 ENCOUNTER — Inpatient Hospital Stay: Payer: Managed Care, Other (non HMO) | Attending: Oncology | Admitting: Oncology

## 2019-07-15 ENCOUNTER — Encounter: Payer: Self-pay | Admitting: Radiation Oncology

## 2019-07-15 ENCOUNTER — Ambulatory Visit
Admission: RE | Admit: 2019-07-15 | Discharge: 2019-07-15 | Disposition: A | Discharge status: Home / Self Care | Payer: Managed Care, Other (non HMO) | Source: Ambulatory Visit | Attending: Radiation Oncology | Admitting: Radiation Oncology

## 2019-07-15 ENCOUNTER — Other Ambulatory Visit: Payer: Self-pay

## 2019-07-15 VITALS — BP 100/78 | HR 67 | Temp 96.1°F | Resp 18 | Wt 192.2 lb

## 2019-07-15 VITALS — BP 100/78 | HR 67 | Temp 96.1°F | Ht 64.5 in | Wt 192.0 lb

## 2019-07-15 DIAGNOSIS — I10 Essential (primary) hypertension: Secondary | ICD-10-CM | POA: Insufficient documentation

## 2019-07-15 DIAGNOSIS — Z79899 Other long term (current) drug therapy: Secondary | ICD-10-CM | POA: Insufficient documentation

## 2019-07-15 DIAGNOSIS — C7931 Secondary malignant neoplasm of brain: Secondary | ICD-10-CM

## 2019-07-15 DIAGNOSIS — F329 Major depressive disorder, single episode, unspecified: Secondary | ICD-10-CM | POA: Insufficient documentation

## 2019-07-15 DIAGNOSIS — Z7189 Other specified counseling: Secondary | ICD-10-CM

## 2019-07-15 DIAGNOSIS — C439 Malignant melanoma of skin, unspecified: Secondary | ICD-10-CM

## 2019-07-15 DIAGNOSIS — F17210 Nicotine dependence, cigarettes, uncomplicated: Secondary | ICD-10-CM | POA: Insufficient documentation

## 2019-07-15 NOTE — Progress Notes (Signed)
Radiation Oncology Follow up Note  Name: Vickie Waters   Date:   07/15/2019 MRN:  [id number] DOB: 1966/06/08    This 53 y.o. female presents to the clinic today for follow-up after biopsy at [facility] for a brain lesion that has now been diagnosed as malignant melanoma.  REFERRING PROVIDER: Pleas Koch, NP  HPI: Patient is a 53 year old female originally consulted back in July when she had subtle neurologic complaints including floaters of vision as well as right frontal headaches.  Brain MRI showed a 3.7 x 2.8 cm hemorrhagic mass in the right temporal lobe.  There is also enhancement throughout the subarachnoid space may represent a leptomeningeal spread.  She was seen at [facility] underwent biopsy.  Biopsy was positive for malignant melanoma of metastatic origin.  Cytology from cerebrospinal fluid was negative.  MRI at [facility] also confirmed leptomeningeal involvement.  She is seen today and is doing fairly well.  She specifically [place] any focal neurologic deficits any change in visual fields.  She is scheduled for PET scan next week to try to determine site of origin.  COMPLICATIONS OF TREATMENT: none  FOLLOW UP COMPLIANCE: keeps appointments   PHYSICAL EXAM:  BP 100/78   Pulse 67   Temp (!) 96.1 F (35.6 C)   Resp 18   Wt 192 lb 3.2 oz (87.2 kg)   BMI 32.48 kg/m  Patient has a vertical scar on her right scalp which is healing well.  Crude visual fields are within normal range motor or sensory and DTR levels are equal symmetric in the upper lower extremities.  Well-developed well-nourished patient in NAD. HEENT reveals PERLA, EOMI, discs not visualized.  Oral cavity is clear. No oral mucosal lesions are identified. Neck is clear without evidence of cervical or supraclavicular adenopathy. Lungs are clear to A&P. Cardiac examination is essentially unremarkable with regular rate and rhythm without murmur rub or thrill. Abdomen is benign with no organomegaly or masses noted. Motor  sensory and DTR levels are equal and symmetric in the upper and lower extremities. Cranial nerves II through XII are grossly intact. Proprioception is intact. No peripheral adenopathy or edema is identified. No motor or sensory levels are noted. Crude visual fields are within normal range.  RADIOLOGY RESULTS: MRI scans reviewed  PLAN: At this time like to go ahead with whole brain radiation therapy based on leptomeningeal involvement.  Would plan on delivering 3000 cGy in 12 fractions.  I would also boost the large solitary metastasis to 20 Gy in 4 fractions using IMRT treatment planning and delivery and MRI fusion study.  Patient will be evaluated today by medical oncology and has a PET CT scan ordered for next week to try to determine site of origin.  Risks and benefits of whole brain radiation therapy including cognitive problems fatigue alteration blood counts hair loss and skin reaction all were discussed in detail.  I have personally set up and ordered CT simulation for early next week.  Her case was presented at our weekly tumor conference.  Patient and husband I both discussed this with with her husband on the phone.  They both seem to comprehend my treatment plan well.  I would like to take this opportunity to thank you for allowing me to participate in the care of your patient.Noreene Filbert, MD

## 2019-07-15 NOTE — Progress Notes (Signed)
Patient stated that she had been doing well with no complaints. 

## 2019-07-15 NOTE — Progress Notes (Signed)
[facility]  [phone] [phone]  ID: Vickie Waters OB: 06/18/1966  MR#: [id number]  CSN#:[id number]  Patient Care Team: Clark, Katherine K, NP as PCP - General (Nurse Practitioner)  CHIEF COMPLAINT: Metastatic melanoma.  INTERVAL HISTORY: Patient returns to clinic today for further evaluation and treatment planning.  Patient underwent open brain biopsy at [facility] on June 30, 2019.  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  She has some mild swelling and tenderness of the right temporal area at the site of her surgery, but otherwise feels well and is asymptomatic.  She continues to have no neurologic complaints. She denies any recent fevers or illnesses.  She has a good appetite and denies weight loss.  She has no chest pain, shortness of breath, cough, or hemoptysis.  She denies any nausea, vomiting, constipation, or diarrhea.  She has no urinary complaints.  Patient offers no further specific complaints today.  REVIEW OF SYSTEMS:   Review of Systems  Constitutional: Negative.  Negative for fever, malaise/fatigue and weight loss.  HENT: Negative.  Negative for hearing loss, sinus pain, sore throat and tinnitus.   Eyes: Negative.  Negative for blurred vision and double vision.  Respiratory: Negative.  Negative for cough, hemoptysis and shortness of breath.   Cardiovascular: Negative.  Negative for chest pain and leg swelling.  Gastrointestinal: Negative.  Negative for abdominal pain, blood in stool and melena.  Genitourinary: Negative.  Negative for dysuria and hematuria.  Musculoskeletal: Negative.  Negative for back pain and neck pain.  Skin: Negative.  Negative for rash.  Neurological: Negative.  Negative for dizziness, sensory change, speech change, focal weakness, seizures, weakness and headaches.  Psychiatric/Behavioral: The patient is nervous/anxious.     As per HPI. Otherwise, a complete review of systems  is negative.  PAST MEDICAL HISTORY: Past Medical History:  Diagnosis Date  . Allergy   . Asthma   . Depression   . Frequent headaches   . Hypertension   . Migraine     PAST SURGICAL HISTORY: Past Surgical History:  Procedure Laterality Date  . CHOLECYSTECTOMY  2001    FAMILY HISTORY: Family History  Problem Relation Age of Onset  . Arthritis Mother   . Hypertension Mother   . Hypertension Father   . Diabetes Father   . Mental illness Maternal Grandfather   . Breast cancer Neg Hx     ADVANCED DIRECTIVES (Y/N):  N  HEALTH MAINTENANCE: Social History   Tobacco Use  . Smoking status: Current Every Day Smoker    Packs/day: 2.00    Types: Cigarettes    Last attempt to quit: 10/15/2015    Years since quitting: 3.7  . Smokeless tobacco: Never Used  Substance Use Topics  . Alcohol use: Yes    Alcohol/week: 0.0 standard drinks    Comment: social  . Drug use: Not on file     Colonoscopy:  PAP:  Bone density:  Lipid panel:  Allergies  Allergen Reactions  . Cephalosporins Hives  . Penicillins Hives    Current Outpatient Medications  Medication Sig Dispense Refill  . amLODipine (NORVASC) 5 MG tablet Take 5 mg by mouth daily.    . Blood Glucose Monitoring Suppl (ONE TOUCH ULTRA MINI) w/Device KIT Use as instructed to test blood sugar 2 times daily 1 each 0  . glucose blood (ONE TOUCH ULTRA TEST) test strip USE ONE STRIP TO CHECK GLUCOSE TWICE DAILY 100 each 1  . Lancet Devices (ONE TOUCH DELICA   LANCING DEV) MISC Use as instructed to check blood sugar 2 times daily 100 each 11  . levETIRAcetam (KEPPRA) 1000 MG tablet Take 100 mg by mouth 2 (two) times daily.    . metoprolol succinate (TOPROL-XL) 25 MG 24 hr tablet Take 1 tablet by mouth once daily 90 tablet 1  . omeprazole (PRILOSEC) 40 MG capsule Take 1 capsule (40 mg total) by mouth daily. For acid reflux. 30 capsule 0  . ONETOUCH DELICA LANCETS FINE MISC Check blood sugar twice a day and as instructed. Dx E11.9  100 each 11  . PARoxetine (PAXIL) 20 MG tablet TAKE 1 TABLET BY MOUTH ONCE DAILY FOR ANXIETY AND FOR DEPRESSION 90 tablet 1   No current facility-administered medications for this visit.     OBJECTIVE: Vitals:   07/15/19 1115  BP: 100/78  Pulse: 67  Temp: (!) 96.1 F (35.6 C)     Body mass index is 32.45 kg/m.    ECOG FS:0 - Asymptomatic  General: Well-developed, well-nourished, no acute distress. Eyes: Pink conjunctiva, anicteric sclera. HEENT: Normocephalic, moist mucous membranes, clear oropharnyx. Lungs: Clear to auscultation bilaterally. Heart: Regular rate and rhythm. No rubs, murmurs, or gallops. Abdomen: Soft, nontender, nondistended. No organomegaly noted, normoactive bowel sounds. Musculoskeletal: No edema, cyanosis, or clubbing. Neuro: Alert, answering all questions appropriately. Cranial nerves grossly intact. Skin: No rashes or petechiae noted. Psych: Normal affect.  LAB RESULTS:  Lab Results  Component Value Date   NA 138 10/19/2018   K 4.3 10/19/2018   CL 107 10/19/2018   CO2 24 10/19/2018   GLUCOSE 104 (H) 10/19/2018   BUN 10 10/19/2018   CREATININE 1.30 (H) 06/08/2019   CALCIUM 9.5 10/19/2018   PROT 7.5 10/19/2018   ALBUMIN 4.2 10/19/2018   AST 19 10/19/2018   ALT 24 10/19/2018   ALKPHOS 98 10/19/2018   BILITOT 0.6 10/19/2018   GFRNONAA >60 05/10/2017   GFRAA >60 05/10/2017    Lab Results  Component Value Date   WBC 10.3 05/10/2017   HGB 14.8 05/10/2017   HCT 43.2 05/10/2017   MCV 89.5 05/10/2017   PLT 234 05/10/2017     STUDIES: No results found.  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 PLAN:    1.  Metastatic melanoma:  Patient underwent open brain biopsy at [facility] on June 30, 2019.  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  BRAF mutation is currently not available.  Patient will get a PET scan next week to complete the staging work-up and to assess for any definitive primary.  She  had consultation with radiation oncology earlier this morning and will initiate XRT within 1 to 2 weeks.  Given the fact that this is likely metastatic this positive with leptomeningeal spread, patient will benefit from systemic treatment even if her PET scan returns negative.  If BRAF+, patient received combination oral chemotherapy with neck inhibitor.  If BRAF negative, patient will receive combination immunotherapy using ipilimumab and nivolumab.  Proceed with XRT as planned.  Return to clinic in 3 weeks for further evaluation and additional treatment planning.   2.  Brain biopsy: Patient returns to [facility] next week to have her sutures removed.  Case discussed with neurosurgery and it is okay to proceed with XRT.  I spent a total of 30 minutes face-to-face with the patient of which greater than 50% of the visit was spent in counseling and coordination of care as detailed above.  Patient expressed understanding and was in agreement with  this plan. She also understands that She can call clinic at any time with any questions, concerns, or complaints.   Cancer Staging Malignant melanoma metastatic to brain (HCC) Staging form: Melanoma of the Skin, AJCC 8th Edition - Clinical stage from 07/15/2019: Stage IV (cTX, cNX, cM1d(0)) - Signed by Finnegan, Timothy J, MD on 07/15/2019   Timothy J Finnegan, MD   07/15/2019 4:13 PM     

## 2019-07-19 ENCOUNTER — Ambulatory Visit
Admission: RE | Admit: 2019-07-19 | Discharge: 2019-07-19 | Disposition: A | Discharge status: Home / Self Care | Payer: Managed Care, Other (non HMO) | Source: Ambulatory Visit | Attending: Radiation Oncology | Admitting: Radiation Oncology

## 2019-07-19 ENCOUNTER — Other Ambulatory Visit: Payer: Self-pay

## 2019-07-19 ENCOUNTER — Other Ambulatory Visit: Payer: Self-pay | Admitting: *Deleted

## 2019-07-19 DIAGNOSIS — R5383 Other fatigue: Secondary | ICD-10-CM | POA: Diagnosis not present

## 2019-07-19 DIAGNOSIS — F329 Major depressive disorder, single episode, unspecified: Secondary | ICD-10-CM | POA: Diagnosis not present

## 2019-07-19 DIAGNOSIS — Z79899 Other long term (current) drug therapy: Secondary | ICD-10-CM | POA: Diagnosis not present

## 2019-07-19 DIAGNOSIS — I10 Essential (primary) hypertension: Secondary | ICD-10-CM | POA: Diagnosis not present

## 2019-07-19 DIAGNOSIS — Z510 Encounter for antineoplastic radiation therapy: Secondary | ICD-10-CM | POA: Diagnosis not present

## 2019-07-19 DIAGNOSIS — J45909 Unspecified asthma, uncomplicated: Secondary | ICD-10-CM | POA: Diagnosis not present

## 2019-07-19 DIAGNOSIS — F17210 Nicotine dependence, cigarettes, uncomplicated: Secondary | ICD-10-CM | POA: Diagnosis not present

## 2019-07-19 DIAGNOSIS — C439 Malignant melanoma of skin, unspecified: Secondary | ICD-10-CM | POA: Diagnosis not present

## 2019-07-19 DIAGNOSIS — Z923 Personal history of irradiation: Secondary | ICD-10-CM | POA: Diagnosis not present

## 2019-07-19 DIAGNOSIS — C7931 Secondary malignant neoplasm of brain: Secondary | ICD-10-CM | POA: Diagnosis not present

## 2019-07-19 MED ORDER — DEXAMETHASONE 4 MG PO TABS: 4.0000 mg | ORAL_TABLET | Freq: Every day | ORAL | 0 refills | Status: DC

## 2019-07-19 MED ORDER — LANSOPRAZOLE 30 MG PO CPDR: 30.0000 mg | DELAYED_RELEASE_CAPSULE | Freq: Every day | ORAL | 3 refills | Status: AC

## 2019-07-20 ENCOUNTER — Other Ambulatory Visit: Payer: Self-pay | Admitting: *Deleted

## 2019-07-20 ENCOUNTER — Encounter
Admission: RE | Admit: 2019-07-20 | Discharge: 2019-07-20 | Disposition: A | Discharge status: Home / Self Care | Payer: Managed Care, Other (non HMO) | Source: Ambulatory Visit | Attending: Oncology | Admitting: Oncology

## 2019-07-20 DIAGNOSIS — C7931 Secondary malignant neoplasm of brain: Secondary | ICD-10-CM

## 2019-07-20 DIAGNOSIS — C439 Malignant melanoma of skin, unspecified: Secondary | ICD-10-CM | POA: Insufficient documentation

## 2019-07-20 DIAGNOSIS — Z510 Encounter for antineoplastic radiation therapy: Secondary | ICD-10-CM | POA: Diagnosis not present

## 2019-07-20 LAB — GLUCOSE, CAPILLARY: Glucose-Capillary: 110 mg/dL — ABNORMAL HIGH (ref 70–99)

## 2019-07-20 MED ORDER — FLUDEOXYGLUCOSE F - 18 (FDG) INJECTION
9.9000 | Freq: Once | INTRAVENOUS | Status: AC | PRN
  Administered 2019-07-20: 10.39 via INTRAVENOUS

## 2019-07-21 ENCOUNTER — Encounter: Payer: Self-pay | Admitting: Oncology

## 2019-07-22 NOTE — Progress Notes (Signed)
[name]  [phone] [phone]  ID: Vickie Waters OB: Mar 25, 1966  MR#: [id number]  OHY#:[id number]  Patient Care Team: Pleas Koch, NP as PCP - General (Nurse Practitioner)  CHIEF COMPLAINT: Metastatic melanoma, BRAF-.  INTERVAL HISTORY: Patient returns to clinic today for further evaluation and treatment planning.  She has noted increased fatigue, but otherwise is tolerating her XRT well.  She continues to have swelling and tenderness in the right temporal area at the site of her surgery.  She has no neurologic complaints.  She denies any recent fevers or illnesses.  She has a good appetite and denies weight loss.  She has no chest pain, shortness of breath, cough, or hemoptysis.  She denies any nausea, vomiting, constipation, or diarrhea.  She has no urinary complaints.  Patient offers no further specific complaints today.  REVIEW OF SYSTEMS:   Review of Systems  Constitutional: Positive for malaise/fatigue. Negative for fever and weight loss.  HENT: Negative.  Negative for hearing loss, sinus pain, sore throat and tinnitus.   Eyes: Negative.  Negative for blurred vision and double vision.  Respiratory: Negative.  Negative for cough, hemoptysis and shortness of breath.   Cardiovascular: Negative.  Negative for chest pain and leg swelling.  Gastrointestinal: Negative.  Negative for abdominal pain, blood in stool and melena.  Genitourinary: Negative.  Negative for dysuria and hematuria.  Musculoskeletal: Negative.  Negative for back pain and neck pain.  Skin: Negative.  Negative for rash.  Neurological: Negative.  Negative for dizziness, sensory change, speech change, focal weakness, seizures, weakness and headaches.  Psychiatric/Behavioral: The patient is nervous/anxious.     As per HPI. Otherwise, a complete review of systems is negative.  PAST MEDICAL HISTORY: Past Medical History:  Diagnosis Date  . Allergy   . Asthma   .  Depression   . Frequent headaches   . Hypertension   . Migraine     PAST SURGICAL HISTORY: Past Surgical History:  Procedure Laterality Date  . CHOLECYSTECTOMY  2001    FAMILY HISTORY: Family History  Problem Relation Age of Onset  . Arthritis Mother   . Hypertension Mother   . Hypertension Father   . Diabetes Father   . Mental illness Maternal Grandfather   . Breast cancer Neg Hx     ADVANCED DIRECTIVES (Y/N):  N  HEALTH MAINTENANCE: Social History   Tobacco Use  . Smoking status: Current Every Day Smoker    Packs/day: 2.00    Types: Cigarettes    Last attempt to quit: 10/15/2015    Years since quitting: 3.8  . Smokeless tobacco: Never Used  Substance Use Topics  . Alcohol use: Yes    Alcohol/week: 0.0 standard drinks    Comment: social  . Drug use: Not on file     Colonoscopy:  PAP:  Bone density:  Lipid panel:  Allergies  Allergen Reactions  . Cephalosporins Hives  . Penicillins Hives    Current Outpatient Medications  Medication Sig Dispense Refill  . amLODipine (NORVASC) 5 MG tablet Take 5 mg by mouth daily.    . Blood Glucose Monitoring Suppl (ONE TOUCH ULTRA MINI) w/Device KIT Use as instructed to test blood sugar 2 times daily 1 each 0  . dexamethasone (DECADRON) 4 MG tablet Take 1 tablet (4 mg total) by mouth daily. 30 tablet 0  . glucose blood (ONE TOUCH ULTRA TEST) test strip USE ONE STRIP TO CHECK GLUCOSE TWICE DAILY 100 each 1  . Lancet Devices (  ONE TOUCH DELICA LANCING DEV) MISC Use as instructed to check blood sugar 2 times daily 100 each 11  . lansoprazole (PREVACID) 30 MG capsule Take 1 capsule (30 mg total) by mouth daily at 12 noon. 30 capsule 3  . levETIRAcetam (KEPPRA) 1000 MG tablet Take 100 mg by mouth 2 (two) times daily.    . metoprolol succinate (TOPROL-XL) 25 MG 24 hr tablet Take 1 tablet by mouth once daily 90 tablet 1  . omeprazole (PRILOSEC) 40 MG capsule Take 1 capsule (40 mg total) by mouth daily. For acid reflux. 30  capsule 0  . ONETOUCH DELICA LANCETS FINE MISC Check blood sugar twice a day and as instructed. Dx E11.9 100 each 11  . PARoxetine (PAXIL) 20 MG tablet TAKE 1 TABLET BY MOUTH ONCE DAILY FOR ANXIETY AND FOR DEPRESSION 90 tablet 1   No current facility-administered medications for this visit.     OBJECTIVE: Vitals:   08/05/19 0944  BP: (!) 132/93  Pulse: (!) 53  Resp: 16  Temp: (!) 97.5 F (36.4 C)     Body mass index is 31.26 kg/m.    ECOG FS:0 - Asymptomatic  General: Well-developed, well-nourished, no acute distress. Eyes: Pink conjunctiva, anicteric sclera. HEENT: Normocephalic, moist mucous membranes, clear oropharnyx.  Right temporal swelling. Lungs: Clear to auscultation bilaterally. Heart: Regular rate and rhythm. No rubs, murmurs, or gallops. Abdomen: Soft, nontender, nondistended. No organomegaly noted, normoactive bowel sounds. Musculoskeletal: No edema, cyanosis, or clubbing. Neuro: Alert, answering all questions appropriately. Cranial nerves grossly intact. Skin: No rashes or petechiae noted. Psych: Normal affect.  LAB RESULTS:  Lab Results  Component Value Date   NA 138 10/19/2018   K 4.3 10/19/2018   CL 107 10/19/2018   CO2 24 10/19/2018   GLUCOSE 104 (H) 10/19/2018   BUN 10 10/19/2018   CREATININE 1.30 (H) 06/08/2019   CALCIUM 9.5 10/19/2018   PROT 7.5 10/19/2018   ALBUMIN 4.2 10/19/2018   AST 19 10/19/2018   ALT 24 10/19/2018   ALKPHOS 98 10/19/2018   BILITOT 0.6 10/19/2018   GFRNONAA >60 05/10/2017   GFRAA >60 05/10/2017    Lab Results  Component Value Date   WBC 10.4 08/03/2019   HGB 13.9 08/03/2019   HCT 43.0 08/03/2019   MCV 92.1 08/03/2019   PLT 335 08/03/2019     STUDIES: Nm Pet Image Initial (pi) Whole Body  Result Date: 07/20/2019 CLINICAL DATA:  Initial treatment strategy for melanoma staging. Known brain metastasis. EXAM: NUCLEAR MEDICINE PET WHOLE BODY TECHNIQUE: 10.4 mCi F-18 FDG was injected intravenously. Full-ring PET  imaging was performed from the skull base to thigh after the radiotracer. CT data was obtained and used for attenuation correction and anatomic localization. Fasting blood glucose: 110 mg/dl COMPARISON:  06/10/2019 chest abdomen and pelvic CTs. Brain MR 06/08/2019. FINDINGS: Mediastinal blood pool activity: SUV max 3.4 HEAD/NECK: Low-level hypermetabolism and soft tissue swelling about the right frontal scalp. Just deep to this is the area of right temporal lobe resection. No cervical nodal hypermetabolism. Incidental CT findings: No cervical adenopathy. CHEST: No pulmonary parenchymal or thoracic nodal hypermetabolism. Incidental CT findings: Deferred to recent diagnostic chest CT. No superimposed acute process. ABDOMEN/PELVIS: No abnormal abdominal hypermetabolism. An isolated left inguinal node measures 8 mm and a S.U.V. max of 2.4 on image 295/3. Incidental CT findings: Deferred to recent diagnostic CT. Cholecystectomy. Normal adrenal glands. Low-density right renal lesion was detailed on prior contrast enhanced CT, suboptimally evaluated. SKELETON: No abnormal marrow activity. Incidental CT  findings: none EXTREMITIES: A focus of hypermetabolism about the right thenar eminence is suboptimally evaluated on CT. Incidental CT findings: Degenerative changes of both hips and sacroiliac joints. IMPRESSION: 1. No typical findings of hypermetabolic metastatic disease. 2. Hypermetabolism about the right thenar eminence warrants physical exam correlation to exclude site of primary melanoma. 3. Hypermetabolic left inguinal node is favored to be reactive and is not pathologic by size criteria. Recommend attention on follow-up. Electronically Signed   By: Abigail Miyamoto M.D.   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 PLAN:    1.  Metastatic melanoma:  Patient underwent open brain biopsy at [facility] on June 30, 2019.  Final pathology confirmed malignant melanoma  metastatic to brain.  BRAF mutation is negative.  PET scan results from July 20, 2019 reviewed independently and report as above with no hypermetabolic disease indicating a definitive primary.  Although there is no obvious site of disease, patient will still benefit from systemic therapy using ipilimumab and nivolumab every 3 weeks for 4 treatments then transitioning to maintenance nivolumab every 2 weeks.  Patient wishes to finish XRT and have a 1 to 2-week break prior to initiating immunotherapy, therefore she will return to clinic on Thursday, August 24, 2021 initiate cycle 1 of combination immunotherapy.  2.  Brain biopsy: Patient has appointment at [facility] on August 23, 2019 for continued postsurgical follow-up.  I spent a total of 30 minutes face-to-face with the patient of which greater than 50% of the visit was spent in counseling and coordination of care as detailed above.   Patient expressed understanding and was in agreement with this plan. She also understands that She can call clinic at any time with any questions, concerns, or complaints.   Cancer Staging Malignant melanoma metastatic to brain [facility]) Staging form: Melanoma of the Skin, AJCC 8th Edition - Clinical stage from 07/15/2019: Stage IV (cTX, cNX, cM1d(0)) - Signed by Lloyd Huger, MD on 07/15/2019   Lloyd Huger, MD   08/05/2019 12:34 PM

## 2019-07-26 ENCOUNTER — Ambulatory Visit
Admission: RE | Admit: 2019-07-26 | Discharge: 2019-07-26 | Disposition: A | Discharge status: Home / Self Care | Payer: Managed Care, Other (non HMO) | Source: Ambulatory Visit | Attending: Radiation Oncology | Admitting: Radiation Oncology

## 2019-07-26 ENCOUNTER — Other Ambulatory Visit: Payer: Self-pay

## 2019-07-26 DIAGNOSIS — Z510 Encounter for antineoplastic radiation therapy: Secondary | ICD-10-CM | POA: Diagnosis not present

## 2019-07-27 ENCOUNTER — Other Ambulatory Visit: Payer: Self-pay

## 2019-07-27 ENCOUNTER — Ambulatory Visit
Admission: RE | Admit: 2019-07-27 | Discharge: 2019-07-27 | Disposition: A | Discharge status: Home / Self Care | Payer: Managed Care, Other (non HMO) | Source: Ambulatory Visit | Attending: Radiation Oncology | Admitting: Radiation Oncology

## 2019-07-27 DIAGNOSIS — Z510 Encounter for antineoplastic radiation therapy: Secondary | ICD-10-CM | POA: Diagnosis not present

## 2019-07-28 ENCOUNTER — Other Ambulatory Visit: Payer: Self-pay

## 2019-07-28 ENCOUNTER — Ambulatory Visit
Admission: RE | Admit: 2019-07-28 | Discharge: 2019-07-28 | Disposition: A | Discharge status: Home / Self Care | Payer: Managed Care, Other (non HMO) | Source: Ambulatory Visit | Attending: Radiation Oncology | Admitting: Radiation Oncology

## 2019-07-28 DIAGNOSIS — Z510 Encounter for antineoplastic radiation therapy: Secondary | ICD-10-CM | POA: Diagnosis not present

## 2019-07-29 ENCOUNTER — Other Ambulatory Visit: Payer: Self-pay

## 2019-07-29 ENCOUNTER — Ambulatory Visit
Admission: RE | Admit: 2019-07-29 | Discharge: 2019-07-29 | Disposition: A | Discharge status: Home / Self Care | Payer: Managed Care, Other (non HMO) | Source: Ambulatory Visit | Attending: Radiation Oncology | Admitting: Radiation Oncology

## 2019-07-29 DIAGNOSIS — Z510 Encounter for antineoplastic radiation therapy: Secondary | ICD-10-CM | POA: Diagnosis not present

## 2019-08-01 ENCOUNTER — Ambulatory Visit
Admission: RE | Admit: 2019-08-01 | Discharge: 2019-08-01 | Disposition: A | Discharge status: Home / Self Care | Payer: Managed Care, Other (non HMO) | Source: Ambulatory Visit | Attending: Radiation Oncology | Admitting: Radiation Oncology

## 2019-08-01 ENCOUNTER — Other Ambulatory Visit: Payer: Self-pay

## 2019-08-01 DIAGNOSIS — Z510 Encounter for antineoplastic radiation therapy: Secondary | ICD-10-CM | POA: Diagnosis not present

## 2019-08-02 ENCOUNTER — Other Ambulatory Visit: Payer: Self-pay

## 2019-08-02 ENCOUNTER — Ambulatory Visit
Admission: RE | Admit: 2019-08-02 | Discharge: 2019-08-02 | Disposition: A | Discharge status: Home / Self Care | Payer: Managed Care, Other (non HMO) | Source: Ambulatory Visit | Attending: Radiation Oncology | Admitting: Radiation Oncology

## 2019-08-02 DIAGNOSIS — Z510 Encounter for antineoplastic radiation therapy: Secondary | ICD-10-CM | POA: Diagnosis not present

## 2019-08-03 ENCOUNTER — Inpatient Hospital Stay: Payer: Managed Care, Other (non HMO) | Attending: Oncology

## 2019-08-03 ENCOUNTER — Ambulatory Visit
Admission: RE | Admit: 2019-08-03 | Discharge: 2019-08-03 | Disposition: A | Discharge status: Home / Self Care | Payer: Managed Care, Other (non HMO) | Source: Ambulatory Visit | Attending: Radiation Oncology | Admitting: Radiation Oncology

## 2019-08-03 ENCOUNTER — Other Ambulatory Visit: Payer: Self-pay

## 2019-08-03 DIAGNOSIS — Z923 Personal history of irradiation: Secondary | ICD-10-CM | POA: Insufficient documentation

## 2019-08-03 DIAGNOSIS — Z510 Encounter for antineoplastic radiation therapy: Secondary | ICD-10-CM | POA: Diagnosis not present

## 2019-08-03 DIAGNOSIS — I10 Essential (primary) hypertension: Secondary | ICD-10-CM | POA: Insufficient documentation

## 2019-08-03 DIAGNOSIS — C7931 Secondary malignant neoplasm of brain: Secondary | ICD-10-CM

## 2019-08-03 DIAGNOSIS — F17210 Nicotine dependence, cigarettes, uncomplicated: Secondary | ICD-10-CM | POA: Insufficient documentation

## 2019-08-03 DIAGNOSIS — F329 Major depressive disorder, single episode, unspecified: Secondary | ICD-10-CM | POA: Insufficient documentation

## 2019-08-03 DIAGNOSIS — R5383 Other fatigue: Secondary | ICD-10-CM | POA: Insufficient documentation

## 2019-08-03 DIAGNOSIS — J45909 Unspecified asthma, uncomplicated: Secondary | ICD-10-CM | POA: Insufficient documentation

## 2019-08-03 DIAGNOSIS — C439 Malignant melanoma of skin, unspecified: Secondary | ICD-10-CM | POA: Insufficient documentation

## 2019-08-03 DIAGNOSIS — Z79899 Other long term (current) drug therapy: Secondary | ICD-10-CM | POA: Insufficient documentation

## 2019-08-03 LAB — CBC
HCT: 43 % (ref 36.0–46.0)
Hemoglobin: 13.9 g/dL (ref 12.0–15.0)
MCH: 29.8 pg (ref 26.0–34.0)
MCHC: 32.3 g/dL (ref 30.0–36.0)
MCV: 92.1 fL (ref 80.0–100.0)
Platelets: 335 10*3/uL (ref 150–400)
RBC: 4.67 MIL/uL (ref 3.87–5.11)
RDW: 13 % (ref 11.5–15.5)
WBC: 10.4 10*3/uL (ref 4.0–10.5)
nRBC: 0 % (ref 0.0–0.2)

## 2019-08-04 ENCOUNTER — Ambulatory Visit
Admission: RE | Admit: 2019-08-04 | Discharge: 2019-08-04 | Disposition: A | Discharge status: Home / Self Care | Payer: Managed Care, Other (non HMO) | Source: Ambulatory Visit | Attending: Radiation Oncology | Admitting: Radiation Oncology

## 2019-08-04 ENCOUNTER — Other Ambulatory Visit: Payer: Self-pay

## 2019-08-04 DIAGNOSIS — Z510 Encounter for antineoplastic radiation therapy: Secondary | ICD-10-CM | POA: Diagnosis not present

## 2019-08-04 NOTE — Progress Notes (Signed)
Called patient for pre-visit evaluation. Patient concerned about some skin tags on breast and wanting them removed

## 2019-08-05 ENCOUNTER — Inpatient Hospital Stay (HOSPITAL_BASED_OUTPATIENT_CLINIC_OR_DEPARTMENT_OTHER): Payer: Managed Care, Other (non HMO) | Admitting: Oncology

## 2019-08-05 ENCOUNTER — Encounter: Payer: Self-pay | Admitting: Oncology

## 2019-08-05 ENCOUNTER — Other Ambulatory Visit: Payer: Self-pay

## 2019-08-05 ENCOUNTER — Ambulatory Visit
Admission: RE | Admit: 2019-08-05 | Discharge: 2019-08-05 | Disposition: A | Discharge status: Home / Self Care | Payer: Managed Care, Other (non HMO) | Source: Ambulatory Visit | Attending: Radiation Oncology | Admitting: Radiation Oncology

## 2019-08-05 ENCOUNTER — Other Ambulatory Visit: Payer: Self-pay | Admitting: Primary Care

## 2019-08-05 VITALS — BP 132/93 | HR 53 | Temp 97.5°F | Resp 16 | Wt 185.0 lb

## 2019-08-05 DIAGNOSIS — C7931 Secondary malignant neoplasm of brain: Secondary | ICD-10-CM

## 2019-08-05 DIAGNOSIS — Z510 Encounter for antineoplastic radiation therapy: Secondary | ICD-10-CM | POA: Diagnosis not present

## 2019-08-05 DIAGNOSIS — K219 Gastro-esophageal reflux disease without esophagitis: Secondary | ICD-10-CM

## 2019-08-05 NOTE — Progress Notes (Signed)
START ON PATHWAY REGIMEN - Melanoma and Other Skin Cancers   Nivolumab 1 mg/kg + Ipilimumab 3 mg/kg q21 Days x 4 Doses:   A cycle is every 21 days:     Nivolumab      Ipilimumab   **Always confirm dose/schedule in your pharmacy ordering system**  Nivolumab 240 mg q14 Days:   A cycle is every 14 days:     Nivolumab   **Always confirm dose/schedule in your pharmacy ordering system**  Patient Characteristics: Melanoma, Distant Metastases or Unresectable Local Recurrence, Unresectable, Asymptomatic, First Line, BRAF V600 Wild Type / BRAF V600 Results Pending or Unknown Disease Classification: Melanoma Disease Subtype: Unknown Therapeutic Status: Distant Metastases BRAF V600 Mutation Status: BRAF V600 Wild Type (No Mutation) Metastatic Disease Type: Asymptomatic Line of Therapy: First Line Intent of Therapy: Non-Curative / Palliative Intent, Discussed with Patient 

## 2019-08-08 ENCOUNTER — Other Ambulatory Visit: Payer: Self-pay

## 2019-08-08 ENCOUNTER — Other Ambulatory Visit: Payer: Self-pay | Admitting: Primary Care

## 2019-08-08 ENCOUNTER — Ambulatory Visit
Admission: RE | Admit: 2019-08-08 | Discharge: 2019-08-08 | Disposition: A | Discharge status: Home / Self Care | Payer: Managed Care, Other (non HMO) | Source: Ambulatory Visit | Attending: Radiation Oncology | Admitting: Radiation Oncology

## 2019-08-08 DIAGNOSIS — Z510 Encounter for antineoplastic radiation therapy: Secondary | ICD-10-CM | POA: Diagnosis not present

## 2019-08-08 DIAGNOSIS — K219 Gastro-esophageal reflux disease without esophagitis: Secondary | ICD-10-CM

## 2019-08-09 ENCOUNTER — Ambulatory Visit
Admission: RE | Admit: 2019-08-09 | Discharge: 2019-08-09 | Disposition: A | Discharge status: Home / Self Care | Payer: Managed Care, Other (non HMO) | Source: Ambulatory Visit | Attending: Radiation Oncology | Admitting: Radiation Oncology

## 2019-08-09 ENCOUNTER — Other Ambulatory Visit: Payer: Self-pay

## 2019-08-09 ENCOUNTER — Other Ambulatory Visit: Payer: Self-pay | Admitting: *Deleted

## 2019-08-09 DIAGNOSIS — Z510 Encounter for antineoplastic radiation therapy: Secondary | ICD-10-CM | POA: Diagnosis not present

## 2019-08-09 MED ORDER — ONDANSETRON HCL 8 MG PO TABS: 8.0000 mg | ORAL_TABLET | Freq: Two times a day (BID) | ORAL | 0 refills | Status: AC

## 2019-08-10 ENCOUNTER — Ambulatory Visit
Admission: RE | Admit: 2019-08-10 | Discharge: 2019-08-10 | Disposition: A | Discharge status: Home / Self Care | Payer: Managed Care, Other (non HMO) | Source: Ambulatory Visit | Attending: Radiation Oncology | Admitting: Radiation Oncology

## 2019-08-10 ENCOUNTER — Other Ambulatory Visit: Payer: Self-pay

## 2019-08-10 ENCOUNTER — Inpatient Hospital Stay: Payer: Managed Care, Other (non HMO)

## 2019-08-10 DIAGNOSIS — C7931 Secondary malignant neoplasm of brain: Secondary | ICD-10-CM

## 2019-08-10 DIAGNOSIS — Z510 Encounter for antineoplastic radiation therapy: Secondary | ICD-10-CM | POA: Diagnosis not present

## 2019-08-10 LAB — CBC
HCT: 43.2 % (ref 36.0–46.0)
Hemoglobin: 14.5 g/dL (ref 12.0–15.0)
MCH: 30.7 pg (ref 26.0–34.0)
MCHC: 33.6 g/dL (ref 30.0–36.0)
MCV: 91.5 fL (ref 80.0–100.0)
Platelets: 233 10*3/uL (ref 150–400)
RBC: 4.72 MIL/uL (ref 3.87–5.11)
RDW: 12.9 % (ref 11.5–15.5)
WBC: 16.6 10*3/uL — ABNORMAL HIGH (ref 4.0–10.5)
nRBC: 0 % (ref 0.0–0.2)

## 2019-08-11 ENCOUNTER — Other Ambulatory Visit: Payer: Self-pay

## 2019-08-11 ENCOUNTER — Ambulatory Visit
Admission: RE | Admit: 2019-08-11 | Discharge: 2019-08-11 | Disposition: A | Discharge status: Home / Self Care | Payer: Managed Care, Other (non HMO) | Source: Ambulatory Visit | Attending: Radiation Oncology | Admitting: Radiation Oncology

## 2019-08-11 DIAGNOSIS — Z510 Encounter for antineoplastic radiation therapy: Secondary | ICD-10-CM | POA: Diagnosis not present

## 2019-08-12 ENCOUNTER — Ambulatory Visit
Admission: RE | Admit: 2019-08-12 | Discharge: 2019-08-12 | Disposition: A | Discharge status: Home / Self Care | Payer: Managed Care, Other (non HMO) | Source: Ambulatory Visit | Attending: Radiation Oncology | Admitting: Radiation Oncology

## 2019-08-12 ENCOUNTER — Other Ambulatory Visit: Payer: Self-pay

## 2019-08-12 DIAGNOSIS — Z510 Encounter for antineoplastic radiation therapy: Secondary | ICD-10-CM | POA: Diagnosis not present

## 2019-08-15 ENCOUNTER — Ambulatory Visit: Payer: Managed Care, Other (non HMO)

## 2019-08-16 ENCOUNTER — Other Ambulatory Visit: Payer: Self-pay

## 2019-08-16 ENCOUNTER — Encounter: Payer: Self-pay | Admitting: Emergency Medicine

## 2019-08-16 ENCOUNTER — Emergency Department: Payer: Managed Care, Other (non HMO)

## 2019-08-16 ENCOUNTER — Inpatient Hospital Stay
Admission: EM | Admit: 2019-08-16 | Discharge: 2019-08-22 | DRG: 054 | Disposition: A | Discharge status: Hospice | Payer: Managed Care, Other (non HMO) | Attending: Internal Medicine | Admitting: Internal Medicine

## 2019-08-16 ENCOUNTER — Ambulatory Visit: Payer: Managed Care, Other (non HMO)

## 2019-08-16 DIAGNOSIS — C439 Malignant melanoma of skin, unspecified: Secondary | ICD-10-CM | POA: Diagnosis present

## 2019-08-16 DIAGNOSIS — F329 Major depressive disorder, single episode, unspecified: Secondary | ICD-10-CM | POA: Diagnosis present

## 2019-08-16 DIAGNOSIS — C801 Malignant (primary) neoplasm, unspecified: Secondary | ICD-10-CM

## 2019-08-16 DIAGNOSIS — Z23 Encounter for immunization: Secondary | ICD-10-CM | POA: Diagnosis not present

## 2019-08-16 DIAGNOSIS — Z880 Allergy status to penicillin: Secondary | ICD-10-CM

## 2019-08-16 DIAGNOSIS — G9341 Metabolic encephalopathy: Secondary | ICD-10-CM | POA: Diagnosis present

## 2019-08-16 DIAGNOSIS — Z8249 Family history of ischemic heart disease and other diseases of the circulatory system: Secondary | ICD-10-CM

## 2019-08-16 DIAGNOSIS — B958 Unspecified staphylococcus as the cause of diseases classified elsewhere: Secondary | ICD-10-CM | POA: Diagnosis not present

## 2019-08-16 DIAGNOSIS — Z833 Family history of diabetes mellitus: Secondary | ICD-10-CM | POA: Diagnosis not present

## 2019-08-16 DIAGNOSIS — R4182 Altered mental status, unspecified: Secondary | ICD-10-CM | POA: Diagnosis present

## 2019-08-16 DIAGNOSIS — R7881 Bacteremia: Secondary | ICD-10-CM | POA: Diagnosis not present

## 2019-08-16 DIAGNOSIS — T361X5A Adverse effect of cephalosporins and other beta-lactam antibiotics, initial encounter: Secondary | ICD-10-CM | POA: Diagnosis not present

## 2019-08-16 DIAGNOSIS — E118 Type 2 diabetes mellitus with unspecified complications: Secondary | ICD-10-CM | POA: Diagnosis present

## 2019-08-16 DIAGNOSIS — Z515 Encounter for palliative care: Secondary | ICD-10-CM

## 2019-08-16 DIAGNOSIS — C799 Secondary malignant neoplasm of unspecified site: Secondary | ICD-10-CM

## 2019-08-16 DIAGNOSIS — E119 Type 2 diabetes mellitus without complications: Secondary | ICD-10-CM | POA: Diagnosis not present

## 2019-08-16 DIAGNOSIS — Z20828 Contact with and (suspected) exposure to other viral communicable diseases: Secondary | ICD-10-CM | POA: Diagnosis present

## 2019-08-16 DIAGNOSIS — F419 Anxiety disorder, unspecified: Secondary | ICD-10-CM | POA: Diagnosis present

## 2019-08-16 DIAGNOSIS — F17210 Nicotine dependence, cigarettes, uncomplicated: Secondary | ICD-10-CM | POA: Diagnosis present

## 2019-08-16 DIAGNOSIS — Z79899 Other long term (current) drug therapy: Secondary | ICD-10-CM | POA: Diagnosis not present

## 2019-08-16 DIAGNOSIS — I10 Essential (primary) hypertension: Secondary | ICD-10-CM | POA: Diagnosis present

## 2019-08-16 DIAGNOSIS — Z881 Allergy status to other antibiotic agents status: Secondary | ICD-10-CM | POA: Diagnosis not present

## 2019-08-16 DIAGNOSIS — R410 Disorientation, unspecified: Secondary | ICD-10-CM | POA: Diagnosis not present

## 2019-08-16 DIAGNOSIS — G9340 Encephalopathy, unspecified: Secondary | ICD-10-CM | POA: Diagnosis not present

## 2019-08-16 DIAGNOSIS — C7931 Secondary malignant neoplasm of brain: Secondary | ICD-10-CM | POA: Diagnosis present

## 2019-08-16 DIAGNOSIS — Z8261 Family history of arthritis: Secondary | ICD-10-CM | POA: Diagnosis not present

## 2019-08-16 DIAGNOSIS — R509 Fever, unspecified: Secondary | ICD-10-CM | POA: Diagnosis not present

## 2019-08-16 DIAGNOSIS — C7949 Secondary malignant neoplasm of other parts of nervous system: Secondary | ICD-10-CM | POA: Diagnosis present

## 2019-08-16 DIAGNOSIS — L500 Allergic urticaria: Secondary | ICD-10-CM | POA: Diagnosis not present

## 2019-08-16 DIAGNOSIS — R400 Somnolence: Secondary | ICD-10-CM

## 2019-08-16 DIAGNOSIS — C800 Disseminated malignant neoplasm, unspecified: Secondary | ICD-10-CM | POA: Diagnosis not present

## 2019-08-16 DIAGNOSIS — L509 Urticaria, unspecified: Secondary | ICD-10-CM | POA: Diagnosis not present

## 2019-08-16 HISTORY — DX: Malignant (primary) neoplasm, unspecified: C80.1

## 2019-08-16 LAB — CBC WITH DIFFERENTIAL/PLATELET
Abs Immature Granulocytes: 0.11 10*3/uL — ABNORMAL HIGH (ref 0.00–0.07)
Basophils Absolute: 0 10*3/uL (ref 0.0–0.1)
Basophils Relative: 0 %
Eosinophils Absolute: 0.3 10*3/uL (ref 0.0–0.5)
Eosinophils Relative: 2 %
HCT: 47.2 % — ABNORMAL HIGH (ref 36.0–46.0)
Hemoglobin: 15.6 g/dL — ABNORMAL HIGH (ref 12.0–15.0)
Immature Granulocytes: 1 %
Lymphocytes Relative: 8 %
Lymphs Abs: 1.4 10*3/uL (ref 0.7–4.0)
MCH: 30.1 pg (ref 26.0–34.0)
MCHC: 33.1 g/dL (ref 30.0–36.0)
MCV: 90.9 fL (ref 80.0–100.0)
Monocytes Absolute: 1 10*3/uL (ref 0.1–1.0)
Monocytes Relative: 6 %
Neutro Abs: 13.7 10*3/uL — ABNORMAL HIGH (ref 1.7–7.7)
Neutrophils Relative %: 83 %
Platelets: 193 10*3/uL (ref 150–400)
RBC: 5.19 MIL/uL — ABNORMAL HIGH (ref 3.87–5.11)
RDW: 13 % (ref 11.5–15.5)
WBC: 16.4 10*3/uL — ABNORMAL HIGH (ref 4.0–10.5)
nRBC: 0 % (ref 0.0–0.2)

## 2019-08-16 LAB — URINE DRUG SCREEN, QUALITATIVE (ARMC ONLY)
Amphetamines, Ur Screen: NOT DETECTED
Barbiturates, Ur Screen: NOT DETECTED
Benzodiazepine, Ur Scrn: NOT DETECTED
Cannabinoid 50 Ng, Ur ~~LOC~~: POSITIVE — AB
Cocaine Metabolite,Ur ~~LOC~~: NOT DETECTED
MDMA (Ecstasy)Ur Screen: NOT DETECTED
Methadone Scn, Ur: NOT DETECTED
Opiate, Ur Screen: NOT DETECTED
Phencyclidine (PCP) Ur S: NOT DETECTED
Tricyclic, Ur Screen: NOT DETECTED

## 2019-08-16 LAB — TSH
TSH: 1.859 u[IU]/mL (ref 0.350–4.500)
TSH: 1.201 u[IU]/mL (ref 0.350–4.500)

## 2019-08-16 LAB — URINALYSIS, COMPLETE (UACMP) WITH MICROSCOPIC
Bacteria, UA: NONE SEEN
Bilirubin Urine: NEGATIVE
Glucose, UA: NEGATIVE mg/dL
Hgb urine dipstick: NEGATIVE
Ketones, ur: NEGATIVE mg/dL
Leukocytes,Ua: NEGATIVE
Nitrite: NEGATIVE
Protein, ur: NEGATIVE mg/dL
Specific Gravity, Urine: 1.023 (ref 1.005–1.030)
pH: 5 (ref 5.0–8.0)

## 2019-08-16 LAB — COMPREHENSIVE METABOLIC PANEL
ALT: 39 U/L (ref 0–44)
AST: 24 U/L (ref 15–41)
Albumin: 4.1 g/dL (ref 3.5–5.0)
Alkaline Phosphatase: 65 U/L (ref 38–126)
Anion gap: 11 (ref 5–15)
BUN: 29 mg/dL — ABNORMAL HIGH (ref 6–20)
CO2: 30 mmol/L (ref 22–32)
Calcium: 9.1 mg/dL (ref 8.9–10.3)
Chloride: 99 mmol/L (ref 98–111)
Creatinine, Ser: 0.94 mg/dL (ref 0.44–1.00)
GFR calc Af Amer: >60 mL/min (ref >60)
GFR calc non Af Amer: >60 mL/min (ref >60)
Glucose, Bld: 120 mg/dL — ABNORMAL HIGH (ref 70–99)
Potassium: 3.8 mmol/L (ref 3.5–5.1)
Sodium: 140 mmol/L (ref 135–145)
Total Bilirubin: 1.3 mg/dL — ABNORMAL HIGH (ref 0.3–1.2)
Total Protein: 6.9 g/dL (ref 6.5–8.1)

## 2019-08-16 LAB — AMMONIA: Ammonia: 10 umol/L (ref 9–35)

## 2019-08-16 LAB — SARS CORONAVIRUS 2 (TAT 6-24 HRS): SARS Coronavirus 2: NEGATIVE

## 2019-08-16 LAB — PROTIME-INR
INR: 0.9 (ref 0.8–1.2)
Prothrombin Time: 12.3 seconds (ref 11.4–15.2)

## 2019-08-16 LAB — APTT: aPTT: <24 seconds — ABNORMAL LOW (ref 24–36)

## 2019-08-16 LAB — LIPASE, BLOOD: Lipase: 30 U/L (ref 11–51)

## 2019-08-16 MED ORDER — PAROXETINE HCL 20 MG PO TABS
20.0000 mg | ORAL_TABLET | Freq: Every day | ORAL | Status: DC
  Administered 2019-08-16 – 2019-08-18 (×3): 20 mg via ORAL
  Filled 2019-08-16 (×4): qty 1

## 2019-08-16 MED ORDER — HEPARIN SODIUM (PORCINE) 5000 UNIT/ML IJ SOLN
5000.0000 [IU] | Freq: Three times a day (TID) | INTRAMUSCULAR | Status: DC
  Administered 2019-08-16 – 2019-08-18 (×5): 5000 [IU] via SUBCUTANEOUS
  Filled 2019-08-16 (×5): qty 1

## 2019-08-16 MED ORDER — SODIUM CHLORIDE 0.9 % IV BOLUS
1000.0000 mL | Freq: Once | INTRAVENOUS | Status: AC
  Administered 2019-08-16: 1000 mL via INTRAVENOUS

## 2019-08-16 MED ORDER — BISACODYL 5 MG PO TBEC
5.0000 mg | DELAYED_RELEASE_TABLET | Freq: Every day | ORAL | Status: DC | PRN
  Administered 2019-08-21: 12:00:00 5 mg via ORAL
  Filled 2019-08-16: qty 1

## 2019-08-16 MED ORDER — AMLODIPINE BESYLATE 5 MG PO TABS
5.0000 mg | ORAL_TABLET | Freq: Every day | ORAL | Status: DC
  Administered 2019-08-16 – 2019-08-22 (×6): 5 mg via ORAL
  Filled 2019-08-16 (×7): qty 1

## 2019-08-16 MED ORDER — SODIUM CHLORIDE 0.9 % IV SOLN
INTRAVENOUS | Status: DC
  Administered 2019-08-16 – 2019-08-21 (×11): via INTRAVENOUS

## 2019-08-16 MED ORDER — INFLUENZA VAC SPLIT QUAD 0.5 ML IM SUSY
0.5000 mL | PREFILLED_SYRINGE | INTRAMUSCULAR | Status: AC
  Administered 2019-08-22: 0.5 mL via INTRAMUSCULAR
  Filled 2019-08-16: qty 0.5

## 2019-08-16 MED ORDER — ACETAMINOPHEN 650 MG RE SUPP
650.0000 mg | Freq: Four times a day (QID) | RECTAL | Status: DC | PRN
  Administered 2019-08-18: 22:00:00 650 mg via RECTAL
  Filled 2019-08-16: qty 1

## 2019-08-16 MED ORDER — HYDROCODONE-ACETAMINOPHEN 5-325 MG PO TABS
1.0000 | ORAL_TABLET | ORAL | Status: DC | PRN
  Administered 2019-08-16: 15:00:00 2 via ORAL
  Administered 2019-08-17: 1 via ORAL
  Administered 2019-08-22: 14:00:00 2 via ORAL
  Filled 2019-08-16 (×2): qty 2
  Filled 2019-08-16: qty 1

## 2019-08-16 MED ORDER — ONDANSETRON HCL 4 MG PO TABS
8.0000 mg | ORAL_TABLET | Freq: Two times a day (BID) | ORAL | Status: DC
  Administered 2019-08-16 – 2019-08-22 (×12): 8 mg via ORAL
  Filled 2019-08-16 (×13): qty 2

## 2019-08-16 MED ORDER — DOCUSATE SODIUM 100 MG PO CAPS
100.0000 mg | ORAL_CAPSULE | Freq: Two times a day (BID) | ORAL | Status: DC
  Administered 2019-08-16 – 2019-08-22 (×12): 100 mg via ORAL
  Filled 2019-08-16 (×12): qty 1

## 2019-08-16 MED ORDER — TRAZODONE HCL 50 MG PO TABS
25.0000 mg | ORAL_TABLET | Freq: Every evening | ORAL | Status: DC | PRN
  Administered 2019-08-19: 22:00:00 25 mg via ORAL
  Filled 2019-08-16: qty 1

## 2019-08-16 MED ORDER — PNEUMOCOCCAL VAC POLYVALENT 25 MCG/0.5ML IJ INJ
0.5000 mL | INJECTION | INTRAMUSCULAR | Status: AC
  Administered 2019-08-22: 0.5 mL via INTRAMUSCULAR
  Filled 2019-08-16: qty 0.5

## 2019-08-16 MED ORDER — ONDANSETRON HCL 4 MG PO TABS: 4.0000 mg | ORAL_TABLET | Freq: Four times a day (QID) | ORAL | Status: DC | PRN

## 2019-08-16 MED ORDER — LEVETIRACETAM 500 MG PO TABS
1000.0000 mg | ORAL_TABLET | Freq: Two times a day (BID) | ORAL | Status: DC
  Administered 2019-08-16 – 2019-08-18 (×5): 1000 mg via ORAL
  Filled 2019-08-16 (×7): qty 2

## 2019-08-16 MED ORDER — METOPROLOL SUCCINATE ER 25 MG PO TB24
25.0000 mg | ORAL_TABLET | Freq: Every day | ORAL | Status: DC
  Administered 2019-08-16 – 2019-08-22 (×6): 25 mg via ORAL
  Filled 2019-08-16 (×7): qty 1

## 2019-08-16 MED ORDER — ACETAMINOPHEN 325 MG PO TABS
650.0000 mg | ORAL_TABLET | Freq: Four times a day (QID) | ORAL | Status: DC | PRN
  Administered 2019-08-18 – 2019-08-19 (×2): 650 mg via ORAL
  Filled 2019-08-16 (×2): qty 2

## 2019-08-16 MED ORDER — DEXAMETHASONE 4 MG PO TABS
4.0000 mg | ORAL_TABLET | Freq: Every day | ORAL | Status: DC
  Administered 2019-08-16 – 2019-08-18 (×3): 4 mg via ORAL
  Filled 2019-08-16 (×3): qty 1

## 2019-08-16 MED ORDER — PANTOPRAZOLE SODIUM 40 MG PO TBEC
40.0000 mg | DELAYED_RELEASE_TABLET | Freq: Every day | ORAL | Status: DC
  Administered 2019-08-16 – 2019-08-22 (×7): 40 mg via ORAL
  Filled 2019-08-16 (×7): qty 1

## 2019-08-16 MED ORDER — ONDANSETRON HCL 4 MG/2ML IJ SOLN: 4.0000 mg | Freq: Four times a day (QID) | INTRAMUSCULAR | Status: DC | PRN

## 2019-08-16 NOTE — ED Provider Notes (Signed)
 [facility] Emergency Department Provider Note  ____________________________________________   None    (approximate)  I have reviewed the triage vital signs and the nursing notes.   HISTORY  Chief Complaint Altered Mental Status  EM caveat: None patient some confusion altered mental status  HPI Vickie Waters is a 53 y.o. female   a history of brain cancer, melanoma  Patient presents today, she is able to tell me she is thrown up several times she has not been feeling well.  Denies being in any particular pain or discomfort though.  She knows the year, not quite sure what month.  EMS reports a started IV fluid on her and found her to be actively vomiting on their arrival nonbloody.  She denies any abdominal pain shortness of breath fevers or chills.  Denies headache.  Currently under care and treatment for brain cancer  Past Medical History:  Diagnosis Date  . Allergy   . Asthma   . Cancer (HCC)   . Depression   . Frequent headaches   . Hypertension   . Migraine     Patient Active Problem List   Diagnosis Date Noted  . Acute metabolic encephalopathy 08/16/2019  . 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 Goals of care, counseling/discussion 07/15/2019  . Altered taste 05/09/2019  . Murmur, cardiac 03/04/2016  . Preventative health care 03/04/2016  . Type 2 diabetes mellitus (HCC) 03/04/2016  . Vitamin D deficiency 03/04/2016  . Migraine with aura and with status migrainosus, not intractable 11/15/2015  . Essential hypertension 08/23/2015  . Anxiety and depression 07/12/2015  . Tobacco abuse 07/12/2015    Past Surgical History:  Procedure Laterality Date  . CHOLECYSTECTOMY  2001    Prior to Admission medications   Medication Sig Start Date End Date Taking? Authorizing Provider  amLODipine (NORVASC) 5 MG tablet Take 5 mg by mouth daily. 07/06/19  Yes [provider]  Blood Glucose Monitoring Suppl (ONE TOUCH  ULTRA MINI) w/Device KIT Use as instructed to test blood sugar 2 times daily 06/06/16  Yes Clark, Katherine K, NP  dexamethasone (DECADRON) 4 MG tablet Take 1 tablet (4 mg total) by mouth daily. 07/19/19  Yes Chrystal, Glenn, MD  glucose blood (ONE TOUCH ULTRA TEST) test strip USE ONE STRIP TO CHECK GLUCOSE TWICE DAILY 05/17/18  Yes Clark, Katherine K, NP  Lancet Devices (ONE TOUCH DELICA LANCING DEV) MISC Use as instructed to check blood sugar 2 times daily 06/16/16  Yes Clark, Katherine K, NP  lansoprazole (PREVACID) 30 MG capsule Take 1 capsule (30 mg total) by mouth daily at 12 noon. 07/19/19  Yes Chrystal, Glenn, MD  levETIRAcetam (KEPPRA) 1000 MG tablet Take 1,000 mg by mouth 2 (two) times daily.  07/05/19 12/02/19 Yes [provider]  metoprolol succinate (TOPROL-XL) 25 MG 24 hr tablet Take 1 tablet by mouth once daily 05/23/19  Yes Clark, Katherine K, NP  ondansetron (ZOFRAN) 8 MG tablet Take 1 tablet (8 mg total) by mouth 2 (two) times daily. 08/09/19  Yes Finnegan, Timothy J, MD  ONETOUCH DELICA LANCETS FINE MISC Check blood sugar twice a day and as instructed. Dx E11.9 06/17/16  Yes Clark, Katherine K, NP  PARoxetine (PAXIL) 20 MG tablet TAKE 1 TABLET BY MOUTH ONCE DAILY FOR ANXIETY AND FOR DEPRESSION Patient taking differently: Take 20 mg by mouth daily.  03/29/19  Yes Clark, Katherine K, NP  omeprazole (PRILOSEC) 40 MG capsule Take 1 capsule (40 mg total) by mouth   daily. For acid reflux. Patient not taking: Reported on 08/16/2019 05/09/19   Clark, Katherine K, NP    Allergies Cephalosporins and Penicillins  Family History  Problem Relation Age of Onset  . Arthritis Mother   . Hypertension Mother   . Hypertension Father   . Diabetes Father   . Mental illness Maternal Grandfather   . Breast cancer Neg Hx     Social History Social History   Tobacco Use  . Smoking status: Current Every Day Smoker    Packs/day: 1.00    Types: Cigarettes  . Smokeless tobacco: Never Used  Substance  Use Topics  . Alcohol use: Yes    Alcohol/week: 0.0 standard drinks    Comment: social  . Drug use: Never    Review of Systems  EM caveat, performed but not certain that patient has full recollection  Constitutional: No fever/chills Eyes: No visual changes. ENT: No neck pain Cardiovascular: Denies chest pain. Respiratory: Denies shortness of breath. Gastrointestinal: No abdominal pain.   Musculoskeletal: Negative for back or muscle pain skin: Negative for rash. Neurological: Negative for headaches but feels just fatigued and weak all over no focal.    ____________________________________________   PHYSICAL EXAM:  VITAL SIGNS: ED Triage Vitals [08/16/19 0739]  Enc Vitals Group     BP      Pulse      Resp      Temp      Temp src      SpO2      Weight 185 lb 3 oz (84 kg)     Height 5' 4" (1.626 m)     Head Circumference      Peak Flow      Pain Score 0     Pain Loc      Pain Edu?      Excl. in GC?     Constitutional: Alert and oriented to self, place, but not to month.  Generally ill-appearing but in no distress.  She is pleasant.  Eyes: Conjunctivae are normal. Head: Atraumatic.  She does have an area that is soft and swollen overlying the right temporal region, there is no overlying erythema or drainage.  Nontender. Nose: No congestion/rhinnorhea. Mouth/Throat: Mucous membranes are dry. Neck: No stridor.  Cardiovascular: Normal rate, regular rhythm. Grossly normal heart sounds.  Good peripheral circulation. Respiratory: Normal respiratory effort.  No retractions. Lungs CTAB. Gastrointestinal: Soft and nontender. No distention. Musculoskeletal: No lower extremity tenderness nor edema. Neurologic:  Normal speech and language. No gross focal neurologic deficits are appreciated.  Generally fatigued.  No pronator drift in any extremity.  Moves all extremities well to command.  No ataxia noted. Skin:  Skin is warm, dry and intact. No rash noted. Psychiatric: Mood and  affect are calm. Speech and behavior are normal.  ____________________________________________   LABS (all labs ordered are listed, but only abnormal results are displayed)  Labs Reviewed  URINALYSIS, COMPLETE (UACMP) WITH MICROSCOPIC - Abnormal; Notable for the following components:      Result Value   Color, Urine YELLOW (*)    APPearance HAZY (*)    All other components within normal limits  COMPREHENSIVE METABOLIC PANEL - Abnormal; Notable for the following components:   Glucose, Bld 120 (*)    BUN 29 (*)    Total Bilirubin 1.3 (*)    All other components within normal limits  CBC WITH DIFFERENTIAL/PLATELET - Abnormal; Notable for the following components:   WBC 16.4 (*)    RBC 5.19 (*)      Hemoglobin 15.6 (*)    HCT 47.2 (*)    Neutro Abs 13.7 (*)    Abs Immature Granulocytes 0.11 (*)    All other components within normal limits  APTT - Abnormal; Notable for the following components:   aPTT <24 (*)    All other components within normal limits  URINE DRUG SCREEN, QUALITATIVE ([facility] ONLY) - Abnormal; Notable for the following components:   Cannabinoid 50 Ng, Ur Kingsley POSITIVE (*)    All other components within normal limits  SARS CORONAVIRUS 2 (TAT 6-24 HRS)  CULTURE, BLOOD (ROUTINE X 2)  CULTURE, BLOOD (ROUTINE X 2)  PROTIME-INR  LIPASE, BLOOD  AMMONIA  TSH  TSH  HIV ANTIBODY (ROUTINE TESTING W REFLEX)  CBG MONITORING, ED   ____________________________________________  EKG  ED ECG REPORT I, Delman Kitten, the attending physician, personally viewed and interpreted this ECG.  Date: 08/16/2019 EKG Time: 745 Rate: 55 Rhythm: normal sinus rhythm QRS Axis: normal Intervals: normal ST/T Wave abnormalities: normal Narrative Interpretation: no evidence of acute ischemia  ____________________________________________  RADIOLOGY  Ct Head Wo Contrast  Result Date: 08/16/2019 CLINICAL DATA:  Altered mental status EXAM: CT HEAD WITHOUT CONTRAST TECHNIQUE: Contiguous  axial images were obtained from the base of the skull through the vertex without intravenous contrast. COMPARISON:  06/08/2019 FINDINGS: Brain: Postoperative right temporal lobe. Superficial to the craniectomy flap is a lobulated tense appearing fluid collection in the scalp and upper masticator space, measuring up to 6 x 3.6 cm on axial slices. No acute hemorrhage, hydrocephalus, or intracranial mass effect. Vascular: No hyperdense vessel or unexpected calcification. Skull: Right pterional craniectomy with cranioplasty. Sinuses/Orbits: Negative IMPRESSION: 1. No acute intracranial finding. 2. 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 Electronically Signed   By: Monte Fantasia M.D.   On: 08/16/2019 08:57   Dg Chest Portable 1 View  Result Date: 08/16/2019 CLINICAL DATA:  Altered mental status EXAM: PORTABLE CHEST 1 VIEW COMPARISON:  PET-CT July 20, 2019 FINDINGS: Lungs are clear. Heart is upper normal in size with pulmonary vascularity normal. No adenopathy. No bone lesions. IMPRESSION: No edema or consolidation. No evident adenopathy. Heart upper normal in size. Electronically Signed   By: Lowella Grip III M.D.   On: 08/16/2019 08:56   CT imaging reviewed, negative for acute finding  ____________________________________________   PROCEDURES  Procedure(s) performed: None  Procedures  Critical Care performed: No  ____________________________________________   INITIAL IMPRESSION / ASSESSMENT AND PLAN / ED COURSE  Pertinent labs & imaging results that were available during my care of the patient were reviewed by me and considered in my medical decision making (see chart for details).   Patient presents for evaluation of altered mental status.  She does have recent history of vomiting, she is currently in no pain and reports all nausea has resolved.  Reassuring abdominal exam.  Heart and lung sounds normal.  Neurologic she is  fatigued and slightly somnolent, but alerts and oriented to name as well as location.  Clinical Course as of Aug 15 1534  Tue Aug 16, 2019  0858 Patient resting.  Alerts to name, oriented to place and self.  Denies being any pain having ongoing nausea or any distress.  Labs reviewed leukocytosis noted, awaiting imaging studies.   [MQ]  [phone] Spoke with husband, updated. She was very tired throughout the entire day yesterday. No known COVID contacts. Increased fatigue, then began with vomiting earlier today.    [MQ]    Clinical  Course User Index [MQ] Quale, Mark, MD   Discussed with Dr. Cook, knowns patient.  Reviewed CT findings, will be happy to consult on patient if requested by medicine service.  And Dr. Cook does not see anything indicate a need for urgent transfer from neurosurgical perspective.  ----------------------------------------- 9:15 AM on 08/16/2019 -----------------------------------------  Case discussed with Dr. Mayo.  Will admit to medicine service, discussed that some testing including urinalysis are pending at time of admission decision.  Vickie Waters was evaluated in Emergency Department on 08/16/2019 for the symptoms described in the history of present illness. She was evaluated in the context of the global COVID-19 pandemic, which necessitated consideration that the patient might be at risk for infection with the SARS-CoV-2 virus that causes COVID-19. Institutional protocols and algorithms that pertain to the evaluation of patients at risk for COVID-19 are in a state of rapid change based on information released by regulatory bodies including the CDC and federal and state organizations. These policies and algorithms were followed during the patient's care in the ED.  And discussed plan of care and admission plan with the patient's husband who is in agreement.  Patient also agreeable ____________________________________________   FINAL CLINICAL IMPRESSION(S) / ED  DIAGNOSES  Final diagnoses:  Disorientation   Nausea vomiting, altered mental status     Note:  This document was prepared using Dragon voice recognition software and may include unintentional dictation errors       Quale, Mark, MD 08/16/19 1535  

## 2019-08-16 NOTE — H&P (Signed)
° °[facility] [place] [facility] ° ° °PATIENT NAME: Vickie Waters   ° °MR#:  [id number] ° °DATE OF BIRTH:  02/24/1966 ° °DATE OF ADMISSION:  08/16/2019 ° °PRIMARY CARE PHYSICIAN: Clark, Katherine K, NP  ° °REQUESTING/REFERRING PHYSICIAN: Quale, Mark, MD ° °CHIEF COMPLAINT:  ° °Chief Complaint  °Patient presents with  °• Altered Mental Status  ° ° °HISTORY OF PRESENT ILLNESS:  °Vickie Waters  is a 53 y.o. female with a known history of metastatic melanoma is being admitted for nausea, vomiting and altered mental status. She was brought in by [facility] from home, complaints of N/V x 2 days.  Per husband, patient woke up with altered mental status this morning, alert to self only upon arrival.  Pt w/ brain cancer; missed last radiation treatment due to being sick.  She reports not been feeling well last few days.  She knows the year, not quite sure what month. Sleeping a lot last few days. °PAST MEDICAL HISTORY:  ° °Past Medical History:  °Diagnosis Date  °• Allergy   °• Asthma   °• Cancer (HCC)   °• Depression   °• Frequent headaches   °• Hypertension   °• Migraine   ° ° °PAST SURGICAL HISTORY:  ° °Past Surgical History:  °Procedure Laterality Date  °• CHOLECYSTECTOMY  2001  ° ° °SOCIAL HISTORY:  ° °Social History  ° °Tobacco Use  °• Smoking status: Current Every Day Smoker  °  Packs/day: 1.00  °  Types: Cigarettes  °• Smokeless tobacco: Never Used  °Substance Use Topics  °• Alcohol use: Yes  °  Alcohol/week: 0.0 standard drinks  °  Comment: social  ° ° °FAMILY HISTORY:  ° °Family History  °Problem Relation Age of Onset  °• Arthritis Mother   °• Hypertension Mother   °• Hypertension Father   °• Diabetes Father   °• Mental illness Maternal Grandfather   °• Breast cancer Neg Hx   ° ° °DRUG ALLERGIES:  ° °Allergies  °Allergen Reactions  °• Cephalosporins Hives  °• Penicillins Hives  ° ° °REVIEW OF SYSTEMS:  ° °Review of Systems  °Constitutional: Positive for malaise/fatigue. Negative for diaphoresis,  fever and weight loss.  °HENT: Negative for ear discharge, ear pain, hearing loss, nosebleeds, sore throat and tinnitus.   °Eyes: Negative for blurred vision and pain.  °Respiratory: Negative for cough, hemoptysis, shortness of breath and wheezing.   °Cardiovascular: Negative for chest pain, palpitations, orthopnea and leg swelling.  °Gastrointestinal: Positive for nausea and vomiting. Negative for abdominal pain, blood in stool, constipation, diarrhea and heartburn.  °Genitourinary: Negative for dysuria, frequency and urgency.  °Musculoskeletal: Negative for back pain and myalgias.  °Skin: Negative for itching and rash.  °Neurological: Negative for dizziness, tingling, tremors, focal weakness, seizures, weakness and headaches.  °Psychiatric/Behavioral: Negative for depression. The patient is not nervous/anxious.   ° ° °MEDICATIONS AT HOME:  ° °Prior to Admission medications   °Medication Sig Start Date End Date Taking? Authorizing Provider  °amLODipine (NORVASC) 5 MG tablet Take 5 mg by mouth daily. 07/06/19  Yes [provider]  °Blood Glucose Monitoring Suppl (ONE TOUCH ULTRA MINI) w/Device KIT Use as instructed to test blood sugar 2 times daily 06/06/16  Yes Clark, Katherine K, NP  °dexamethasone (DECADRON) 4 MG tablet Take 1 tablet (4 mg total) by mouth daily. 07/19/19  Yes Chrystal, Glenn, MD  °glucose blood (ONE TOUCH ULTRA TEST) test strip USE ONE STRIP TO CHECK GLUCOSE TWICE DAILY 05/17/18    Yes Clark, Katherine K, NP  °Lancet Devices (ONE TOUCH DELICA LANCING DEV) MISC Use as instructed to check blood sugar 2 times daily 06/16/16  Yes Clark, Katherine K, NP  °lansoprazole (PREVACID) 30 MG capsule Take 1 capsule (30 mg total) by mouth daily at 12 noon. 07/19/19  Yes Chrystal, Glenn, MD  °levETIRAcetam (KEPPRA) 1000 MG tablet Take 1,000 mg by mouth 2 (two) times daily.  07/05/19 12/02/19 Yes [provider]  °metoprolol succinate (TOPROL-XL) 25 MG 24 hr tablet Take 1 tablet by mouth once daily 05/23/19   Yes Clark, Katherine K, NP  °ondansetron (ZOFRAN) 8 MG tablet Take 1 tablet (8 mg total) by mouth 2 (two) times daily. 08/09/19  Yes Finnegan, Timothy J, MD  °ONETOUCH DELICA LANCETS FINE MISC Check blood sugar twice a day and as instructed. Dx E11.9 06/17/16  Yes Clark, Katherine K, NP  °PARoxetine (PAXIL) 20 MG tablet TAKE 1 TABLET BY MOUTH ONCE DAILY FOR ANXIETY AND FOR DEPRESSION °Patient taking differently: Take 20 mg by mouth daily.  03/29/19  Yes Clark, Katherine K, NP  °omeprazole (PRILOSEC) 40 MG capsule Take 1 capsule (40 mg total) by mouth daily. For acid reflux. °Patient not taking: Reported on 08/16/2019 05/09/19   Clark, Katherine K, NP  ° °  ° °VITAL SIGNS:  °Blood pressure (!) 154/98, pulse (!) 55, resp. rate 14, height 5' 4" (1.626 m), weight 84 kg, SpO2 96 %. ° °PHYSICAL EXAMINATION:  °Physical Exam °HENT:  °   Head: Normocephalic and atraumatic.  °Eyes:  °   Conjunctiva/sclera: Conjunctivae normal.  °   Pupils: Pupils are equal, round, and reactive to light.  °Neck:  °   Musculoskeletal: Normal range of motion and neck supple.  °   Thyroid: No thyromegaly.  °   Trachea: No tracheal deviation.  °Cardiovascular:  °   Rate and Rhythm: Normal rate and regular rhythm.  °   Heart sounds: Normal heart sounds.  °Pulmonary:  °   Effort: Pulmonary effort is normal. No respiratory distress.  °   Breath sounds: Normal breath sounds. No wheezing.  °Chest:  °   Chest wall: No tenderness.  °Abdominal:  °   General: Bowel sounds are normal. There is no distension.  °   Palpations: Abdomen is soft.  °   Tenderness: There is no abdominal tenderness.  °Musculoskeletal: Normal range of motion.  °Skin: °   General: Skin is warm and dry.  °   Findings: No rash.  °Neurological:  °   Mental Status: She is alert and oriented to person, place, and time.  °   Cranial Nerves: No cranial nerve deficit.  ° ° ° °LABORATORY PANEL:  ° °CBC °Recent Labs  °Lab 08/16/19 °0748  °WBC 16.4*  °HGB 15.6*  °HCT 47.2*  °PLT 193   ° °------------------------------------------------------------------------------------------------------------------ ° °Chemistries  °Recent Labs  °Lab 08/16/19 °0748  °NA 140  °K 3.8  °CL 99  °CO2 30  °GLUCOSE 120*  °BUN 29*  °CREATININE 0.94  °CALCIUM 9.1  °AST 24  °ALT 39  °ALKPHOS 65  °BILITOT 1.3*  ° °------------------------------------------------------------------------------------------------------------------ ° °Cardiac Enzymes °No results for input(s): TROPONINI in the last 168 hours. °------------------------------------------------------------------------------------------------------------------ ° °RADIOLOGY:  °Ct Head Wo Contrast ° °Result Date: 08/16/2019 °CLINICAL DATA:  Altered mental status EXAM: CT HEAD WITHOUT CONTRAST TECHNIQUE: Contiguous axial images were obtained from the base of the skull through the vertex without intravenous contrast. COMPARISON:  06/08/2019 FINDINGS: Brain: Postoperative right temporal lobe. Superficial to the craniectomy   the craniectomy flap is a lobulated tense appearing fluid collection in the scalp and upper masticator space, measuring up to 6 x 3.6 cm on axial slices. No acute hemorrhage, hydrocephalus, or intracranial mass effect. Vascular: No hyperdense vessel or unexpected calcification. Skull: Right pterional craniectomy with cranioplasty. Sinuses/Orbits: Negative IMPRESSION: 1. No acute intracranial finding. 2. 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 Electronically Signed   By: Monte Fantasia M.D.   On: 08/16/2019 08:57   Dg Chest Portable 1 View  Result Date: 08/16/2019 CLINICAL DATA:  Altered mental status EXAM: PORTABLE CHEST 1 VIEW COMPARISON:  PET-CT July 20, 2019 FINDINGS: Lungs are clear. Heart is upper normal in size with pulmonary vascularity normal. No adenopathy. No bone lesions. IMPRESSION: No edema or consolidation. No evident adenopathy. Heart upper normal in size. Electronically Signed   By:  Lowella Grip III M.D.   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   -Followed by Dr. Grayland Ormond -getting chemo, XRT and may be immunotherapy -Next follow-up appointment at cancer clinic on Thursday, August 24, 2021 initiate cycle 1 of combination immunotherapy.  - Brain biopsy: [facility], 2019 for continued postsurgical follow-up. 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 Management plans discussed with the patient, family (mother at bedside) and they are in agreement.  CODE STATUS: Full code  TOTAL TIME TAKING CARE OF THIS PATIENT: 45 minutes.    Max Sane M.D on 08/16/2019 at 12:21 PM  Between 7am to 6pm - Pager - [phone]  After 6pm go to [email or web address] - Proofreader  [facility]  Office  [phone]  CC: Primary care physician; Pleas Koch, NP   Note: This dictation was prepared with Dragon dictation along with smaller phrase technology. Any transcriptional errors that result from this process are unintentional.

## 2019-08-16 NOTE — ED Notes (Signed)
Patient transported to CT 

## 2019-08-16 NOTE — ED Triage Notes (Signed)
Pt in via [facility] from home, complaints of N/V x 2 days.  Per husband, patient woke up with altered mental status this morning, alert to self only upon arrival.  Pt w/ brain cancer; missed last radiation treatment due to being sick.  NAD noted at this time.

## 2019-08-16 NOTE — ED Notes (Signed)
Pt dry calm and cooperative at this time; RR equal and unlabored. Floor called prior to ED departure

## 2019-08-16 NOTE — ED Notes (Signed)
Pt transported to room 116.

## 2019-08-16 NOTE — ED Notes (Signed)
ED TO INPATIENT HANDOFF REPORT  ED Nurse Name and Phone #: Caryl Pina, [place]  S Name/Age/Gender Vickie Waters 53 y.o. female Room/Bed: ED15A/ED15A  Code Status   Code Status: Not on file  Home/SNF/Other Home Patient oriented to: self Is this baseline? No   Triage Complete: Triage complete  Chief Complaint Altered Mental Status   Triage Note Pt in via [facility] from home, complaints of N/V x 2 days.  Per husband, patient woke up with altered mental status this morning, alert to self only upon arrival.  Pt w/ brain cancer; missed last radiation treatment due to being sick.  NAD noted at this time.   Allergies Allergies  Allergen Reactions  . Cephalosporins Hives  . Penicillins Hives    Level of Care/Admitting Diagnosis ED Disposition    ED Disposition Condition [facility] Area: [place] [100120]  Level of Care: Med-Surg [16]  Covid Evaluation: Asymptomatic Screening Protocol (No Symptoms)  Diagnosis: Acute metabolic encephalopathy [id number]  Admitting Physician: Max Sane [id number]  Attending Physician: Max Sane [id number]  Estimated length of stay: past midnight tomorrow  Certification:: I certify this patient will need inpatient services for at least 2 midnights  PT Class (Do Not Modify): Inpatient [101]  PT Acc Code (Do Not Modify): Private [1]       B Medical/Surgery History Past Medical History:  Diagnosis Date  . Allergy   . Asthma   . Cancer ([place])   . Depression   . Frequent headaches   . Hypertension   . Migraine    Past Surgical History:  Procedure Laterality Date  . CHOLECYSTECTOMY  2001     A IV Location/Drains/Wounds Patient Lines/Drains/Airways Status   Active Line/Drains/Airways    Name:   Placement date:   Placement time:   Site:   Days:   Peripheral IV 06/10/19 Left Antecubital   06/10/19    0803    Antecubital   67   Peripheral IV 08/16/19 Left Hand   08/16/19    0800    Hand   less than  1   Peripheral IV 08/16/19 Left Antecubital   08/16/19    0800    Antecubital   less than 1          Intake/Output Last 24 hours No intake or output data in the 24 hours ending 08/16/19 1027  Labs/Imaging Results for orders placed or performed during the hospital encounter of 08/16/19 (from the past 48 hour(s))  Comprehensive metabolic panel     Status: Abnormal   Collection Time: 08/16/19  7:48 AM  Result Value Ref Range   Sodium 140 135 - 145 mmol/L   Potassium 3.8 3.5 - 5.1 mmol/L   Chloride 99 98 - 111 mmol/L   CO2 30 22 - 32 mmol/L   Glucose, Bld 120 (H) 70 - 99 mg/dL   BUN 29 (H) 6 - 20 mg/dL   Creatinine, Ser 0.94 0.44 - 1.00 mg/dL   Calcium 9.1 8.9 - 10.3 mg/dL   Total Protein 6.9 6.5 - 8.1 g/dL   Albumin 4.1 3.5 - 5.0 g/dL   AST 24 15 - 41 U/L   ALT 39 0 - 44 U/L   Alkaline Phosphatase 65 38 - 126 U/L   Total Bilirubin 1.3 (H) 0.3 - 1.2 mg/dL   GFR calc non Af Amer >60 >60 mL/min   GFR calc Af Amer >60 >60 mL/min   Anion gap 11 5 - 15  Comment: Performed at [facility], [address] [place], [place] [address]  CBC WITH DIFFERENTIAL     Status: Abnormal   Collection Time: 08/16/19  7:48 AM  Result Value Ref Range   WBC 16.4 (H) 4.0 - 10.5 K/uL   RBC 5.19 (H) 3.87 - 5.11 MIL/uL   Hemoglobin 15.6 (H) 12.0 - 15.0 g/dL   HCT 47.2 (H) 36.0 - 46.0 %   MCV 90.9 80.0 - 100.0 fL   MCH 30.1 26.0 - 34.0 pg   MCHC 33.1 30.0 - 36.0 g/dL   RDW 13.0 11.5 - 15.5 %   Platelets 193 150 - 400 K/uL   nRBC 0.0 0.0 - 0.2 %   Neutrophils Relative % 83 %   Neutro Abs 13.7 (H) 1.7 - 7.7 K/uL   Lymphocytes Relative 8 %   Lymphs Abs 1.4 0.7 - 4.0 K/uL   Monocytes Relative 6 %   Monocytes Absolute 1.0 0.1 - 1.0 K/uL   Eosinophils Relative 2 %   Eosinophils Absolute 0.3 0.0 - 0.5 K/uL   Basophils Relative 0 %   Basophils Absolute 0.0 0.0 - 0.1 K/uL   Immature Granulocytes 1 %   Abs Immature Granulocytes 0.11 (H) 0.00 - 0.07 K/uL    Comment: Performed at [facility], [address] [place], [place] [address]  APTT     Status: Abnormal   Collection Time: 08/16/19  7:48 AM  Result Value Ref Range   aPTT <24 (L) 24 - 36 seconds    Comment: RESULT REPEATED AND VERIFIED Performed at [facility], [address] [place], [place] [address]   Protime-INR     Status: None   Collection Time: 08/16/19  7:48 AM  Result Value Ref Range   Prothrombin Time 12.3 11.4 - 15.2 seconds   INR 0.9 0.8 - 1.2    Comment: (NOTE) INR goal varies based on device and disease states. Performed at [facility], [address] [place], [place] [address]   Lipase, blood     Status: None   Collection Time: 08/16/19  7:48 AM  Result Value Ref Range   Lipase 30 11 - 51 U/L    Comment: Performed at [facility], [address] [place], [place] [address]  TSH     Status: None   Collection Time: 08/16/19  7:48 AM  Result Value Ref Range   TSH 1.859 0.350 - 4.500 uIU/mL    Comment: Performed by a 3rd Generation assay with a functional sensitivity of <=0.01 uIU/mL. Performed at [facility], [address] [place], [place] [address]   Urinalysis, Complete w Microscopic     Status: Abnormal   Collection Time: 08/16/19  9:17 AM  Result Value Ref Range   Color, Urine YELLOW (A) YELLOW   APPearance HAZY (A) CLEAR   Specific Gravity, Urine 1.023 1.005 - 1.030   pH 5.0 5.0 - 8.0   Glucose, UA NEGATIVE NEGATIVE mg/dL   Hgb urine dipstick NEGATIVE NEGATIVE   Bilirubin Urine NEGATIVE NEGATIVE   Ketones, ur NEGATIVE NEGATIVE mg/dL   Protein, ur NEGATIVE NEGATIVE mg/dL   Nitrite NEGATIVE NEGATIVE   Leukocytes,Ua NEGATIVE NEGATIVE   WBC, UA 0-5 0 - 5 WBC/hpf   Bacteria, UA NONE SEEN NONE SEEN   Squamous Epithelial / LPF 0-5 0 - 5   Mucus PRESENT     Comment: Performed at [facility], [address], [place], [place] [address]  Urine Drug Screen,  Qualitative ([place] only)     Status: Abnormal   Collection Time:  08/16/19  9:19 AM  Result Value Ref Range   Tricyclic, Ur Screen NONE DETECTED NONE DETECTED   Amphetamines, Ur Screen NONE DETECTED NONE DETECTED   MDMA (Ecstasy)Ur Screen NONE DETECTED NONE DETECTED   Cocaine Metabolite,Ur [place] NONE DETECTED NONE DETECTED   Opiate, Ur Screen NONE DETECTED NONE DETECTED   Phencyclidine (PCP) Ur S NONE DETECTED NONE DETECTED   Cannabinoid 50 Ng, Ur [place] POSITIVE (A) NONE DETECTED   Barbiturates, Ur Screen NONE DETECTED NONE DETECTED   Benzodiazepine, Ur Scrn NONE DETECTED NONE DETECTED   Methadone Scn, Ur NONE DETECTED NONE DETECTED    Comment: (NOTE) Tricyclics + metabolites, urine    Cutoff 1000 ng/mL Amphetamines + metabolites, urine  Cutoff 1000 ng/mL MDMA (Ecstasy), urine              Cutoff 500 ng/mL Cocaine Metabolite, urine          Cutoff 300 ng/mL Opiate + metabolites, urine        Cutoff 300 ng/mL Phencyclidine (PCP), urine         Cutoff 25 ng/mL Cannabinoid, urine                 Cutoff 50 ng/mL Barbiturates + metabolites, urine  Cutoff 200 ng/mL Benzodiazepine, urine              Cutoff 200 ng/mL Methadone, urine                   Cutoff 300 ng/mL The urine drug screen provides only a preliminary, unconfirmed analytical test result and should not be used for non-medical purposes. Clinical consideration and professional judgment should be applied to any positive drug screen result due to possible interfering substances. A more specific alternate chemical method must be used in order to obtain a confirmed analytical result. Gas chromatography / mass spectrometry (GC/MS) is the preferred confirmat ory method. Performed at [facility], [place], [place] [address]    Ct Head Wo Contrast  Result Date: 08/16/2019 CLINICAL DATA:  Altered mental status EXAM: CT HEAD WITHOUT CONTRAST TECHNIQUE: Contiguous axial images were obtained from the base of the skull through the vertex without intravenous contrast. COMPARISON:   06/08/2019 FINDINGS: Brain: Postoperative right temporal lobe. Superficial to the craniectomy flap is a lobulated tense appearing fluid collection in the scalp and upper masticator space, measuring up to 6 x 3.6 cm on axial slices. No acute hemorrhage, hydrocephalus, or intracranial mass effect. Vascular: No hyperdense vessel or unexpected calcification. Skull: Right pterional craniectomy with cranioplasty. Sinuses/Orbits: Negative IMPRESSION: 1. No acute intracranial finding. 2. 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 Electronically Signed   By: Monte Fantasia M.D.   On: 08/16/2019 08:57   Dg Chest Portable 1 View  Result Date: 08/16/2019 CLINICAL DATA:  Altered mental status EXAM: PORTABLE CHEST 1 VIEW COMPARISON:  PET-CT July 20, 2019 FINDINGS: Lungs are clear. Heart is upper normal in size with pulmonary vascularity normal. No adenopathy. No bone lesions. IMPRESSION: No edema or consolidation. No evident adenopathy. Heart upper normal in size. Electronically Signed   By: Lowella Grip III M.D.   On: 08/16/2019 08:56    Pending Labs Unresulted Labs (From admission, onward)    Start     Ordered   08/16/19 0908  Ammonia  Add-on,   AD     08/16/19 [id number]   08/16/19  0743  SARS CORONAVIRUS 2 (TAT 6-24 HRS) Nasopharyngeal Nasopharyngeal Swab  (Asymptomatic/Tier 2 Patients Labs)  ONCE - STAT,   STAT    Question Answer Comment  Is this test for diagnosis or screening Screening   Symptomatic for COVID-19 as defined by CDC No   Hospitalized for COVID-19 No   Admitted to ICU for COVID-19 No   Previously tested for COVID-19 No   Resident in a congregate (group) care setting No   Employed in healthcare setting No   Pregnant No      08/16/19 0744   08/16/19 0743  Blood Culture (routine x 2)  BLOOD CULTURE X 2,   STAT     08/16/19 0744   Signed and Held  CBC  (heparin)  Once,   R    Comments: Baseline for heparin therapy IF NOT ALREADY  DRAWN.  Notify MD if PLT < 100 K.    Signed and Held   Signed and Held  Creatinine, serum  (heparin)  Once,   R    Comments: Baseline for heparin therapy IF NOT ALREADY DRAWN.    Signed and Held   Signed and Held  HIV Antibody (routine testing w rflx)  (HIV Antibody (Routine testing w reflex) panel)  Once,   R     Signed and Held   Signed and Held  Basic metabolic panel  Tomorrow morning,   R     Signed and Held   Signed and Held  CBC  Tomorrow morning,   R     Signed and Held   Signed and Held  TSH  Once,   R     Signed and Held          Vitals/Pain Today's Vitals   08/16/19 0739 08/16/19 0800 08/16/19 0900  BP:  132/87 (!) 166/90  Pulse:  (!) 59 (!) 58  Resp:  18 16  SpO2:  100% 95%  Weight: 84 kg    Height: 5\' 4"  (1.626 m)    PainSc: 0-No pain      Isolation Precautions No active isolations  Medications Medications  sodium chloride 0.9 % bolus 1,000 mL (1,000 mLs Intravenous New Bag/Given 08/16/19 0851)    Mobility walks High fall risk   Focused Assessments Neuro Assessment Handoff:  Swallow screen pass? N/A Cardiac Rhythm: Normal sinus rhythm, Sinus bradycardia       Neuro Assessment: Exceptions to WDL Neuro Checks:      Last Documented NIHSS Modified Score:   Has TPA been given? No If patient is a Neuro Trauma and patient is going to OR before floor call report to [place] nurse: [phone] or [phone]     R Recommendations: See Admitting Provider Note  Report given to:   Additional Notes: Alert to self only, patient remains lethargic but arousable, continues with difficulty following commands.

## 2019-08-17 ENCOUNTER — Ambulatory Visit
Admission: RE | Admit: 2019-08-17 | Discharge: 2019-08-17 | Disposition: A | Discharge status: Home / Self Care | Payer: Managed Care, Other (non HMO) | Source: Ambulatory Visit | Attending: Radiation Oncology | Admitting: Radiation Oncology

## 2019-08-17 ENCOUNTER — Ambulatory Visit: Payer: Managed Care, Other (non HMO)

## 2019-08-17 LAB — CBC
HCT: 43.1 % (ref 36.0–46.0)
Hemoglobin: 14.4 g/dL (ref 12.0–15.0)
MCH: 30.7 pg (ref 26.0–34.0)
MCHC: 33.4 g/dL (ref 30.0–36.0)
MCV: 91.9 fL (ref 80.0–100.0)
Platelets: 180 10*3/uL (ref 150–400)
RBC: 4.69 MIL/uL (ref 3.87–5.11)
RDW: 13.2 % (ref 11.5–15.5)
WBC: 10.1 10*3/uL (ref 4.0–10.5)
nRBC: 0 % (ref 0.0–0.2)

## 2019-08-17 LAB — BASIC METABOLIC PANEL
Anion gap: 10 (ref 5–15)
BUN: 21 mg/dL — ABNORMAL HIGH (ref 6–20)
CO2: 26 mmol/L (ref 22–32)
Calcium: 9 mg/dL (ref 8.9–10.3)
Chloride: 104 mmol/L (ref 98–111)
Creatinine, Ser: 0.66 mg/dL (ref 0.44–1.00)
GFR calc Af Amer: >60 mL/min (ref >60)
GFR calc non Af Amer: >60 mL/min (ref >60)
Glucose, Bld: 122 mg/dL — ABNORMAL HIGH (ref 70–99)
Potassium: 4.3 mmol/L (ref 3.5–5.1)
Sodium: 140 mmol/L (ref 135–145)

## 2019-08-17 LAB — BLOOD CULTURE ID PANEL (REFLEXED)

## 2019-08-17 LAB — GLUCOSE, CAPILLARY: Glucose-Capillary: 96 mg/dL (ref 70–99)

## 2019-08-17 MED ORDER — ENSURE ENLIVE PO LIQD
237.0000 mL | Freq: Three times a day (TID) | ORAL | Status: DC
  Administered 2019-08-19 – 2019-08-22 (×4): 237 mL via ORAL

## 2019-08-17 MED ORDER — ADULT MULTIVITAMIN W/MINERALS CH
1.0000 | ORAL_TABLET | Freq: Every day | ORAL | Status: DC
  Administered 2019-08-18 – 2019-08-22 (×5): 1 via ORAL
  Filled 2019-08-17 (×5): qty 1

## 2019-08-17 NOTE — Progress Notes (Signed)
PHARMACY - PHYSICIAN COMMUNICATION CRITICAL VALUE ALERT - BLOOD CULTURE IDENTIFICATION (BCID)  Vickie Waters is an 53 y.o. female who presented to [facility] on 08/16/2019 with a chief complaint of N/V x 2 days. Husband reported AMS PTA. Patient w/ brain cancer receiving treatment.  Assessment:  1/4 bottles staph species, methicillin resistant  Name of physician (or Provider) Contacted: Dr. Manuella Ghazi  Current antibiotics: none  Changes to prescribed antibiotics recommended: Expect possible contaminant. Patient has been afebrile. Plan to monitor patient off of antibiotics prior to discharging.  Results for orders placed or performed during the hospital encounter of 08/16/19  Blood Culture ID Panel (Reflexed) (Collected: 08/16/2019  7:48 AM)  Result Value Ref Range   Enterococcus species NOT DETECTED NOT DETECTED   Listeria monocytogenes NOT DETECTED NOT DETECTED   Staphylococcus species DETECTED (A) NOT DETECTED   Staphylococcus aureus (BCID) NOT DETECTED NOT DETECTED   Methicillin resistance DETECTED (A) NOT DETECTED   Streptococcus species NOT DETECTED NOT DETECTED   Streptococcus agalactiae NOT DETECTED NOT DETECTED   Streptococcus pneumoniae NOT DETECTED NOT DETECTED   Streptococcus pyogenes NOT DETECTED NOT DETECTED   Acinetobacter baumannii NOT DETECTED NOT DETECTED   Enterobacteriaceae species NOT DETECTED NOT DETECTED   Enterobacter cloacae complex NOT DETECTED NOT DETECTED   Escherichia coli NOT DETECTED NOT DETECTED   Klebsiella oxytoca NOT DETECTED NOT DETECTED   Klebsiella pneumoniae NOT DETECTED NOT DETECTED   Proteus species NOT DETECTED NOT DETECTED   Serratia marcescens NOT DETECTED NOT DETECTED   Haemophilus influenzae NOT DETECTED NOT DETECTED   Neisseria meningitidis NOT DETECTED NOT DETECTED   Pseudomonas aeruginosa NOT DETECTED NOT DETECTED   Candida albicans NOT DETECTED NOT DETECTED   Candida glabrata NOT DETECTED NOT DETECTED   Candida krusei NOT DETECTED NOT  DETECTED   Candida parapsilosis NOT DETECTED NOT DETECTED   Candida tropicalis NOT DETECTED NOT DETECTED    Tawnya Crook, PharmD 08/17/2019  11:05 AM

## 2019-08-17 NOTE — Progress Notes (Signed)
[facility] [place] NAME: Vickie Waters    MR#:  [id number]  DATE OF BIRTH:  [birth year]  SUBJECTIVE:  CHIEF COMPLAINT:   Chief Complaint  Patient presents with  . Altered Mental Status  Patient is feeling much better, no nausea or vomiting.  Sitting in the chair wanting to eat breakfast, low grade fever REVIEW OF SYSTEMS:  Review of Systems  Constitutional: Positive for malaise/fatigue. Negative for diaphoresis, fever and weight loss.  HENT: Negative for ear discharge, ear pain, hearing loss, nosebleeds, sore throat and tinnitus.   Eyes: Negative for blurred vision and pain.  Respiratory: Negative for cough, hemoptysis, shortness of breath and wheezing.   Cardiovascular: Negative for chest pain, palpitations, orthopnea and leg swelling.  Gastrointestinal: Negative for abdominal pain, blood in stool, constipation, diarrhea, heartburn, nausea and vomiting.  Genitourinary: Negative for dysuria, frequency and urgency.  Musculoskeletal: Negative for back pain and myalgias.  Skin: Negative for itching and rash.  Neurological: Negative for dizziness, tingling, tremors, focal weakness, seizures, weakness and headaches.  Psychiatric/Behavioral: Negative for depression. The patient is not nervous/anxious.     DRUG ALLERGIES:   Allergies  Allergen Reactions  . Cephalosporins Hives  . Penicillins Hives   VITALS:  Blood pressure (!) 147/98, pulse 70, temperature 98.6 F (37 C), temperature source Oral, resp. rate 16, height 5\' 4"  (1.626 m), weight 85.3 kg, SpO2 95 %. PHYSICAL EXAMINATION:  Physical Exam HENT:     Head: Normocephalic and atraumatic.  Eyes:     Conjunctiva/sclera: Conjunctivae normal.     Pupils: Pupils are equal, round, and reactive to light.  Neck:     Musculoskeletal: Normal range of motion and neck supple.     Thyroid: No thyromegaly.     Trachea: No tracheal deviation.  Cardiovascular:     Rate and Rhythm:  Normal rate and regular rhythm.     Heart sounds: Normal heart sounds.  Pulmonary:     Effort: Pulmonary effort is normal. No respiratory distress.     Breath sounds: Normal breath sounds. No wheezing.  Chest:     Chest wall: No tenderness.  Abdominal:     General: Bowel sounds are normal. There is no distension.     Palpations: Abdomen is soft.     Tenderness: There is no abdominal tenderness.  Musculoskeletal: Normal range of motion.  Skin:    General: Skin is warm and dry.     Findings: No rash.  Neurological:     Mental Status: She is alert and oriented to person, place, and time.     Cranial Nerves: No cranial nerve deficit.    LABORATORY PANEL:  Female CBC Recent Labs  Lab 08/17/19 0512  WBC 10.1  HGB 14.4  HCT 43.1  PLT 180   ------------------------------------------------------------------------------------------------------------------ Chemistries  Recent Labs  Lab 08/16/19 0748 08/17/19 0512  NA 140 140  K 3.8 4.3  CL 99 104  CO2 30 26  GLUCOSE 120* 122*  BUN 29* 21*  CREATININE 0.94 0.66  CALCIUM 9.1 9.0  AST 24  --   ALT 39  --   ALKPHOS 65  --   BILITOT 1.3*  --    RADIOLOGY:  No results found. 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  -Followed by Dr. Grayland Ormond -getting chemo, XRT and may be immunotherapy -Next follow-up appointment at cancer clinic on Thursday, October 8 initiate cycle 1 of  combination immunotherapy.  -Brain biopsy: Patient has appointment at [facility] on August 23, 2019 for continued postsurgical follow-up. - CT head shows Right temporal lobe mass resection with large pseudomeningocele overlying the cranioplasty plate  * 1/4 bottles staph species, methicillin resistant -likely false-positive, skin contaminant -Considering immunocompromise state and low-grade fever we will observe her for at least another 1 to 2 days for other blood culture to result to make sure we are not dealing with real bacteremia    All the records are reviewed and case discussed with Care Management/Social Worker. Management plans discussed with the patient, family (discussed with husband Heater Osuch [id number] and they are in agreement.  CODE STATUS: Full Code  TOTAL TIME TAKING CARE OF THIS PATIENT: 35 minutes.   More than 50% of the time was spent in counseling/coordination of care: YES  POSSIBLE D/C IN 1-2 DAYS, DEPENDING ON CLINICAL CONDITION.   Max Sane M.D on 08/17/2019 at 12:45 PM  Between 7am to 6pm - Pager - [phone]  After 6pm go to [email or web address] - Proofreader  [facility]  Office  [phone]  CC: Primary care physician; Pleas Koch, NP  Note: This dictation was prepared with Dragon dictation along with smaller phrase technology. Any transcriptional errors that result from this process are unintentional.

## 2019-08-17 NOTE — Progress Notes (Signed)
Initial Nutrition Assessment  DOCUMENTATION CODES:   Obesity unspecified  INTERVENTION:  Provide Ensure Enlive po TID, each supplement provides 350 kcal and 20 grams of protein.   Provide daily MVI.  NUTRITION DIAGNOSIS:   Increased nutrient needs related to catabolic illness(metastatic melanoma) as evidenced by estimated needs.  GOAL:   Patient will meet greater than or equal to 90% of their needs  MONITOR:   PO intake, Supplement acceptance, Labs, Weight trends, I & O's  REASON FOR ASSESSMENT:   Malnutrition Screening Tool, Consult Assessment of nutrition requirement/status, Poor PO  ASSESSMENT:   53 year old female with PMHx of asthma, depression, HTN metastatic melanoma of brain on chemo/XRT admitted with acute metabolic encephalopathy, N/V.   Met with patient at bedside. She reports she has had a decreased appetite and intake for the past 5-6 weeks. She tries to eat meals but experiences early satiety and can only eat small amounts. Yesterday she ate 0% of her dinner. Today at breakfast she had some grits and then at lunch she ate 1/2 of her fish. She has not yet tried Ensure but she is amenable to drinking them to help meet calorie/protein needs.  Patient reports her UBW was 225 lbs. Per chart she was 97.5 kg on 05/09/2019. She is now 85.3 kg (188.05 lbs). She has lost 12.2 kg (12.5% body weight) over the past 3 months, which is significant for time frame.  Medications reviewed and include: Decadron 4 mg daily, Colace 100 mg BID, Keppra, Zofran 8 mg BID, pantoprazole, NS at 100 mL/hr.  Labs reviewed: CBG 96, BUN 21.  Patient is at risk for malnutrition.   NUTRITION - FOCUSED PHYSICAL EXAM:    Most Recent Value  Orbital Region  No depletion  Upper Arm Region  Mild depletion  Thoracic and Lumbar Region  No depletion  Temple Region  Mild depletion  Clavicle Bone Region  No depletion  Clavicle and Acromion Bone Region  No depletion  Scapular Bone Region  No  depletion  Dorsal Hand  No depletion  Patellar Region  No depletion  Anterior Thigh Region  No depletion  Posterior Calf Region  No depletion  Edema (RD Assessment)  None  Hair  Reviewed  Eyes  Reviewed  Mouth  Reviewed  Skin  Reviewed  Nails  Reviewed     Diet Order:   Diet Order            Diet regular Room service appropriate? Yes; Fluid consistency: Thin  Diet effective now             EDUCATION NEEDS:   Education needs have been addressed  Skin:  Skin Assessment: Reviewed RN Assessment  Last BM:  08/15/2019 per chart  Height:   Ht Readings from Last 1 Encounters:  08/16/19 _0  (1.626 m)   Weight:   Wt Readings from Last 1 Encounters:  08/17/19 85.3 kg   Ideal Body Weight:  54.5 kg  BMI:  Body mass index is 32.28 kg/m.  Estimated Nutritional Needs:   Kcal:  1900-2100  Protein:  105-125 grams  Fluid:  1.9-2.1 L/day  Willey Blade, MS, RD, LDN Office: [phone] Pager: [phone] After Hours/Weekend Pager: [phone]

## 2019-08-18 ENCOUNTER — Inpatient Hospital Stay: Payer: Managed Care, Other (non HMO)

## 2019-08-18 ENCOUNTER — Ambulatory Visit: Payer: Managed Care, Other (non HMO)

## 2019-08-18 ENCOUNTER — Ambulatory Visit
Admission: RE | Admit: 2019-08-18 | Discharge: 2019-08-18 | Disposition: A | Discharge status: Home / Self Care | Payer: Managed Care, Other (non HMO) | Source: Ambulatory Visit | Attending: Radiation Oncology | Admitting: Radiation Oncology

## 2019-08-18 DIAGNOSIS — C800 Disseminated malignant neoplasm, unspecified: Secondary | ICD-10-CM

## 2019-08-18 DIAGNOSIS — F17210 Nicotine dependence, cigarettes, uncomplicated: Secondary | ICD-10-CM

## 2019-08-18 DIAGNOSIS — Z881 Allergy status to other antibiotic agents status: Secondary | ICD-10-CM

## 2019-08-18 DIAGNOSIS — C799 Secondary malignant neoplasm of unspecified site: Secondary | ICD-10-CM

## 2019-08-18 DIAGNOSIS — Z880 Allergy status to penicillin: Secondary | ICD-10-CM

## 2019-08-18 DIAGNOSIS — G9340 Encephalopathy, unspecified: Secondary | ICD-10-CM

## 2019-08-18 DIAGNOSIS — E119 Type 2 diabetes mellitus without complications: Secondary | ICD-10-CM

## 2019-08-18 DIAGNOSIS — B958 Unspecified staphylococcus as the cause of diseases classified elsewhere: Secondary | ICD-10-CM

## 2019-08-18 DIAGNOSIS — R410 Disorientation, unspecified: Secondary | ICD-10-CM

## 2019-08-18 DIAGNOSIS — I10 Essential (primary) hypertension: Secondary | ICD-10-CM

## 2019-08-18 DIAGNOSIS — C439 Malignant melanoma of skin, unspecified: Secondary | ICD-10-CM

## 2019-08-18 DIAGNOSIS — R7881 Bacteremia: Secondary | ICD-10-CM

## 2019-08-18 DIAGNOSIS — G9341 Metabolic encephalopathy: Secondary | ICD-10-CM

## 2019-08-18 LAB — GLUCOSE, CAPILLARY
Glucose-Capillary: 98 mg/dL (ref 70–99)
Glucose-Capillary: 104 mg/dL — ABNORMAL HIGH (ref 70–99)

## 2019-08-18 LAB — BLOOD GAS, ARTERIAL
Acid-Base Excess: 2.3 mmol/L — ABNORMAL HIGH (ref 0.0–2.0)
Bicarbonate: 26.5 mmol/L (ref 20.0–28.0)
FIO2: 0.21
O2 Saturation: 94.9 %
Patient temperature: 37
pCO2 arterial: 39 mmHg (ref 32.0–48.0)
pH, Arterial: 7.44 (ref 7.350–7.450)
pO2, Arterial: 72 mmHg — ABNORMAL LOW (ref 83.0–108.0)

## 2019-08-18 LAB — BASIC METABOLIC PANEL
Anion gap: 8 (ref 5–15)
BUN: 18 mg/dL (ref 6–20)
CO2: 27 mmol/L (ref 22–32)
Calcium: 8.9 mg/dL (ref 8.9–10.3)
Chloride: 103 mmol/L (ref 98–111)
Creatinine, Ser: 0.82 mg/dL (ref 0.44–1.00)
GFR calc Af Amer: >60 mL/min (ref >60)
GFR calc non Af Amer: >60 mL/min (ref >60)
Glucose, Bld: 96 mg/dL (ref 70–99)
Potassium: 4 mmol/L (ref 3.5–5.1)
Sodium: 138 mmol/L (ref 135–145)

## 2019-08-18 LAB — CBC
HCT: 41.3 % (ref 36.0–46.0)
Hemoglobin: 13.7 g/dL (ref 12.0–15.0)
MCH: 30 pg (ref 26.0–34.0)
MCHC: 33.2 g/dL (ref 30.0–36.0)
MCV: 90.4 fL (ref 80.0–100.0)
Platelets: 168 10*3/uL (ref 150–400)
RBC: 4.57 MIL/uL (ref 3.87–5.11)
RDW: 13 % (ref 11.5–15.5)
WBC: 12.8 10*3/uL — ABNORMAL HIGH (ref 4.0–10.5)
nRBC: 0 % (ref 0.0–0.2)

## 2019-08-18 LAB — CULTURE, BLOOD (ROUTINE X 2)

## 2019-08-18 LAB — HIV ANTIBODY (ROUTINE TESTING W REFLEX): HIV Screen 4th Generation wRfx: NONREACTIVE

## 2019-08-18 MED ORDER — LORAZEPAM 2 MG/ML IJ SOLN
1.0000 mg | Freq: Once | INTRAMUSCULAR | Status: AC
  Administered 2019-08-18: 1 mg via INTRAVENOUS

## 2019-08-18 MED ORDER — LEVETIRACETAM IN NACL 1000 MG/100ML IV SOLN
1000.0000 mg | Freq: Two times a day (BID) | INTRAVENOUS | Status: DC
  Administered 2019-08-18 – 2019-08-21 (×6): 1000 mg via INTRAVENOUS
  Filled 2019-08-18 (×9): qty 100

## 2019-08-18 MED ORDER — DEXAMETHASONE SODIUM PHOSPHATE 4 MG/ML IJ SOLN
4.0000 mg | Freq: Three times a day (TID) | INTRAMUSCULAR | Status: DC
  Administered 2019-08-18 – 2019-08-22 (×12): 4 mg via INTRAVENOUS
  Filled 2019-08-18 (×12): qty 1

## 2019-08-18 MED ORDER — ENOXAPARIN SODIUM 40 MG/0.4ML ~~LOC~~ SOLN
40.0000 mg | SUBCUTANEOUS | Status: DC
  Administered 2019-08-18 – 2019-08-21 (×4): 40 mg via SUBCUTANEOUS
  Filled 2019-08-18 (×4): qty 0.4

## 2019-08-18 MED ORDER — LORAZEPAM 2 MG/ML IJ SOLN
INTRAMUSCULAR | Status: AC
  Filled 2019-08-18: qty 1

## 2019-08-18 MED ORDER — DEXAMETHASONE 4 MG PO TABS: 4.0000 mg | ORAL_TABLET | Freq: Three times a day (TID) | ORAL | Status: DC

## 2019-08-18 MED ORDER — GADOBUTROL 1 MMOL/ML IV SOLN
8.0000 mL | Freq: Once | INTRAVENOUS | Status: AC | PRN
  Administered 2019-08-18: 8 mL via INTRAVENOUS

## 2019-08-18 NOTE — Progress Notes (Signed)
Pt presents with reponse to pain sternal rub (2150; 08/18/2019), Increase in temp and blood pressure. Pt demonstrates slight shivering, unable to respond when questioned, not opening eyes in response , pupils pupils equal round and reactive to light, unable to follow commands. Rapid response called. See progress note via Duggins, Roseanne Reno, RN. MD orders completed. Continuing to monitor.

## 2019-08-18 NOTE — Progress Notes (Signed)
   08/18/19 2200  Clinical Encounter Type  Visited With Patient not available;Health care provider  Visit Type Initial  Referral From Nurse  Consult/Referral To Chaplain  Spiritual Encounters  Spiritual Needs Other (Comment)  Chaplain received page for RR. Chaplain arrived and made pastoral presences known. Nurse said it was ok to leave.

## 2019-08-18 NOTE — Progress Notes (Signed)
Onset of increased confusion and patient not following commands, rambling with speech. No slurring. Smile symmetrical, grips weak but equal.   Oncology at bedside, notified attending physician - Manuella Ghazi.   See new orders. Will continue to monitor patient closely. Madlyn Frankel, RN

## 2019-08-18 NOTE — Progress Notes (Signed)
Rapid Response Event Note  Overview:  Pt with AMS onset this afternoon after receiving Ativan for MRI.  Currently is responsive only with painful or noxious stimuli. Pt is also now febrile and hypertensive.    Initial Focused Assessment: GCS 10, pupils brisk, equal and reactive, not following commands.  Interventions:  Tylenol rectal, MEWS red VS protocol, paged Dr Jannifer [place].  Notified ICU charge as a heads up to this patient.  They will monitor at a distance for now.   Plan of Care (if not transferred): ABG, chest xray, UA ordered.. will monitor closely. [facility] aware Event Summary:   at      at          [facility]

## 2019-08-18 NOTE — Progress Notes (Addendum)
Paged on-call MD - Vianne Bulls - regarding MRI results, neurology consult to be placed. Madlyn Frankel, RN

## 2019-08-18 NOTE — Consult Note (Signed)
Hematology/Oncology Consult note [facility] Telephone:([phone] Fax:([phone]) [phone]  Patient Care Team: Pleas Koch, NP as PCP - General (Nurse Practitioner)   Name of the patient: Vickie Waters  [id number]  [birth year]   Date of visit: 08/18/19 REASON FOR COSULTATION:  Melanoma, acute encephalopathy History of presenting illness-  53 y.o. female with PMH listed at below who presents to ER for evaluation of nausea, vomiting, and to the mental status. Patient was sent to ER by [facility] from home.  Complaining nausea vomiting for 2 days. 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  Patient underwent open brain biopsy at [facility] on 06/30/2019.  She follows up with Dr. Cecille Aver was off this week.  I am covering Dr. Grayland Ormond to see this patient. PET scan from 07/20/2019 showed no hypermetabolic disease indicating a definitive primary. Decision was made to proceed with radiation, plan immunotherapy combination with ipilimumab and Durvalumab. Patient missed her radiation appointments due to being sick. During admission, she received radiation. Husband called cancer center requesting oncologist to touch base with patient on the floor. Family is very anxious and want to discuss.   I went to see patient around 2PM, patient's mother was at the bedside.  Nurse at bedside.  According to nurse and patient's mother, patient has acute change of her mental status.  Mother reports that this morning patient's mental status was at baseline before going to the radiation.  Just now, patient has started to talk meaningless sentences and not following commands.  Review of Systems  Unable to perform ROS: Mental status change    Allergies  Allergen Reactions  . Cephalosporins Hives  . Penicillins Hives    Patient Active Problem List   Diagnosis Date Noted  . Acute metabolic encephalopathy [id number]  . Malignant melanoma  metastatic to brain ([place]) 07/15/2019  . Goals of care, counseling/discussion 07/15/2019  . Altered taste 05/09/2019  . Murmur, cardiac 03/04/2016  . Preventative health care 03/04/2016  . Type 2 diabetes mellitus ([place]) 03/04/2016  . Vitamin D deficiency 03/04/2016  . Migraine with aura and with status migrainosus, not intractable 11/15/2015  . Essential hypertension 08/23/2015  . Anxiety and depression 07/12/2015  . Tobacco abuse 07/12/2015     Past Medical History:  Diagnosis Date  . Allergy   . Asthma   . Cancer ([place])   . Depression   . Frequent headaches   . Hypertension   . Migraine      Past Surgical History:  Procedure Laterality Date  . CHOLECYSTECTOMY  2001    Social History   Socioeconomic History  . Marital status: Married    Spouse name: Not on file  . Number of children: Not on file  . Years of education: Not on file  . Highest education level: Not on file  Occupational History  . Not on file  Social Needs  . Financial resource strain: Not hard at all  . Food insecurity    Worry: Never true    Inability: Never true  . Transportation needs    Medical: No    Non-medical: No  Tobacco Use  . Smoking status: Current Every Day Smoker    Packs/day: 1.00    Types: Cigarettes  . Smokeless tobacco: Never Used  Substance and Sexual Activity  . Alcohol use: Yes    Alcohol/week: 0.0 standard drinks    Comment: social  . Drug use: Never  . Sexual activity: Not on file  Lifestyle  . Physical activity    Days per week: Patient refused    Minutes per session: Patient refused  . Stress: Not on file  Relationships  . Social Herbalist on phone: Patient refused    Gets together: Patient refused    Attends religious service: Patient refused    Active member of club or organization: Patient refused    Attends meetings of clubs or organizations: Patient refused    Relationship status: Patient refused  . Intimate partner violence    Fear of  current or ex partner: No    Emotionally abused: No    Physically abused: No    Forced sexual activity: No  Other Topics Concern  . Not on file  Social History Narrative   Married.   2 children   Works as an Scientist, water quality.   Enjoys spending time with her family.     Family History  Problem Relation Age of Onset  . Arthritis Mother   . Hypertension Mother   . Hypertension Father   . Diabetes Father   . Mental illness Maternal Grandfather   . Breast cancer Neg Hx      Current Facility-Administered Medications:  .  0.9 %  sodium chloride infusion, , Intravenous, Continuous, Max Sane, MD, Last Rate: 100 mL/hr at 08/18/19 1223 .  acetaminophen (TYLENOL) tablet 650 mg, 650 mg, Oral, Q6H PRN, 650 mg at 08/18/19 0527 **OR** acetaminophen (TYLENOL) suppository 650 mg, 650 mg, Rectal, Q6H PRN, Manuella Ghazi, Vipul, MD .  amLODipine (NORVASC) tablet 5 mg, 5 mg, Oral, Daily, Manuella Ghazi, Vipul, MD, 5 mg at 08/18/19 1137 .  bisacodyl (DULCOLAX) EC tablet 5 mg, 5 mg, Oral, Daily PRN, Manuella Ghazi, Vipul, MD .  dexamethasone (DECADRON) tablet 4 mg, 4 mg, Oral, Daily, Manuella Ghazi, Vipul, MD, 4 mg at 08/18/19 1137 .  docusate sodium (COLACE) capsule 100 mg, 100 mg, Oral, BID, Max Sane, MD, 100 mg at 08/18/19 1137 .  enoxaparin (LOVENOX) injection 40 mg, 40 mg, Subcutaneous, Q24H, Shah, Vipul, MD .  feeding supplement (ENSURE ENLIVE) (ENSURE ENLIVE) liquid 237 mL, 237 mL, Oral, TID BM, Manuella Ghazi, Vipul, MD .  HYDROcodone-acetaminophen (NORCO/VICODIN) 5-325 MG per tablet 1-2 tablet, 1-2 tablet, Oral, Q4H PRN, Max Sane, MD, 1 tablet at 08/17/19 1659 .  influenza vac split quadrivalent PF (FLUARIX) injection 0.5 mL, 0.5 mL, Intramuscular, Tomorrow-1000, Shah, Vipul, MD .  levETIRAcetam (KEPPRA) tablet 1,000 mg, 1,000 mg, Oral, BID, Manuella Ghazi, Vipul, MD, 1,000 mg at 08/18/19 1139 .  metoprolol succinate (TOPROL-XL) 24 hr tablet 25 mg, 25 mg, Oral, Daily, Manuella Ghazi, Vipul, MD, 25 mg at 08/18/19 1138 .  multivitamin with minerals  tablet 1 tablet, 1 tablet, Oral, Daily, Max Sane, MD, 1 tablet at 08/18/19 1137 .  ondansetron (ZOFRAN) tablet 4 mg, 4 mg, Oral, Q6H PRN **OR** ondansetron (ZOFRAN) injection 4 mg, 4 mg, Intravenous, Q6H PRN, Manuella Ghazi, Vipul, MD .  ondansetron (ZOFRAN) tablet 8 mg, 8 mg, Oral, BID, Max Sane, MD, 8 mg at 08/18/19 1138 .  pantoprazole (PROTONIX) EC tablet 40 mg, 40 mg, Oral, Daily, Max Sane, MD, 40 mg at 08/18/19 1136 .  PARoxetine (PAXIL) tablet 20 mg, 20 mg, Oral, Daily, Manuella Ghazi, Vipul, MD, 20 mg at 08/18/19 1138 .  pneumococcal 23 valent vaccine (PNU-IMMUNE) injection 0.5 mL, 0.5 mL, Intramuscular, Tomorrow-1000, Manuella Ghazi, Vipul, MD .  traZODone (DESYREL) tablet 25 mg, 25 mg, Oral, QHS PRN, Max Sane, MD   Physical exam:  Vitals:   08/18/19 0520 08/18/19 0843 08/18/19  1435 08/18/19 1446  BP: (!) 144/82 125/80 129/81 129/81  Pulse: (!) 59 62 67 67  Resp: 16 20  18   Temp: 100.2 F (37.9 C) 99 F (37.2 C) 99.5 F (37.5 C) 99.5 F (37.5 C)  TempSrc: Oral Oral Oral   SpO2: 100% 96% 100% 96%  Weight:      Height:       Physical Exam  Constitutional: No distress.  HENT:  Right temple soft tissue swelling.  Eyes: Pupils are equal, round, and reactive to light. No scleral icterus.  Pulmonary/Chest: Effort normal.  Abdominal: Soft.  Musculoskeletal:     Comments: Bilateral upper extremity strength 5 out of 5. Lower extremity strength not able to be assessed as patient does not follow commands.  Range of motion normal.   Neurological: She is alert.  Orientated to herself. She talks, not answering my questions. Follows up some but not all commands  Skin: Skin is warm.        CMP Latest Ref Rng & Units 08/18/2019  Glucose 70 - 99 mg/dL 96  BUN 6 - 20 mg/dL 18  Creatinine 0.44 - 1.00 mg/dL 0.82  Sodium 135 - 145 mmol/L 138  Potassium 3.5 - 5.1 mmol/L 4.0  Chloride 98 - 111 mmol/L 103  CO2 22 - 32 mmol/L 27  Calcium 8.9 - 10.3 mg/dL 8.9  Total Protein 6.5 - 8.1 g/dL -  Total  Bilirubin 0.3 - 1.2 mg/dL -  Alkaline Phos 38 - 126 U/L -  AST 15 - 41 U/L -  ALT 0 - 44 U/L -   CBC Latest Ref Rng & Units 08/18/2019  WBC 4.0 - 10.5 K/uL 12.8(H)  Hemoglobin 12.0 - 15.0 g/dL 13.7  Hematocrit 36.0 - 46.0 % 41.3  Platelets 150 - 400 K/uL 168   RADIOGRAPHIC STUDIES: I have personally reviewed the radiological images as listed and agreed with the findings in the report.   Ct Head Wo Contrast  Result Date: 08/16/2019 CLINICAL DATA:  Altered mental status EXAM: CT HEAD WITHOUT CONTRAST TECHNIQUE: Contiguous axial images were obtained from the base of the skull through the vertex without intravenous contrast. COMPARISON:  06/08/2019 FINDINGS: Brain: Postoperative right temporal lobe. Superficial to the craniectomy flap is a lobulated tense appearing fluid collection in the scalp and upper masticator space, measuring up to 6 x 3.6 cm on axial slices. No acute hemorrhage, hydrocephalus, or intracranial mass effect. Vascular: No hyperdense vessel or unexpected calcification. Skull: Right pterional craniectomy with cranioplasty. Sinuses/Orbits: Negative IMPRESSION: 1. No acute intracranial finding. 2. 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 Electronically Signed   By: Monte Fantasia M.D.   On: 08/16/2019 08:57   Nm Pet Image Initial (pi) Whole Body  Result Date: 07/20/2019 CLINICAL DATA:  Initial treatment strategy for melanoma staging. Known brain metastasis. EXAM: NUCLEAR MEDICINE PET WHOLE BODY TECHNIQUE: 10.4 mCi F-18 FDG was injected intravenously. Full-ring PET imaging was performed from the skull base to thigh after the radiotracer. CT data was obtained and used for attenuation correction and anatomic localization. Fasting blood glucose: 110 mg/dl COMPARISON:  06/10/2019 chest abdomen and pelvic CTs. Brain MR 06/08/2019. FINDINGS: Mediastinal blood pool activity: SUV max 3.4 HEAD/NECK: Low-level hypermetabolism and soft  tissue swelling about the right frontal scalp. Just deep to this is the area of right temporal lobe resection. No cervical nodal hypermetabolism. Incidental CT findings: No cervical adenopathy. CHEST: No pulmonary parenchymal or thoracic nodal hypermetabolism. Incidental CT findings: Deferred to  recent diagnostic chest CT. No superimposed acute process. ABDOMEN/PELVIS: No abnormal abdominal hypermetabolism. An isolated left inguinal node measures 8 mm and a S.U.V. max of 2.4 on image 295/3. Incidental CT findings: Deferred to recent diagnostic CT. Cholecystectomy. Normal adrenal glands. Low-density right renal lesion was detailed on prior contrast enhanced CT, suboptimally evaluated. SKELETON: No abnormal marrow activity. Incidental CT findings: none EXTREMITIES: A focus of hypermetabolism about the right thenar eminence is suboptimally evaluated on CT. Incidental CT findings: Degenerative changes of both hips and sacroiliac joints. IMPRESSION: 1. No typical findings of hypermetabolic metastatic disease. 2. Hypermetabolism about the right thenar eminence warrants physical exam correlation to exclude site of primary melanoma. 3. Hypermetabolic left inguinal node is favored to be reactive and is not pathologic by size criteria. Recommend attention on follow-up. Electronically Signed   By: Abigail Miyamoto M.D.   On: 07/20/2019 14:56   Dg Chest Portable 1 View  Result Date: 08/16/2019 CLINICAL DATA:  Altered mental status EXAM: PORTABLE CHEST 1 VIEW COMPARISON:  PET-CT July 20, 2019 FINDINGS: Lungs are clear. Heart is upper normal in size with pulmonary vascularity normal. No adenopathy. No bone lesions. IMPRESSION: No edema or consolidation. No evident adenopathy. Heart upper normal in size. Electronically Signed   By: Lowella Grip III M.D.   On: 08/16/2019 08:56    Assessment and plan- Patient is a 53 y.o. female with history of stage IV melanoma with brain involvement currently under going radiation  presented with acute change of mental status, nausea vomiting.  #Stage IV melanoma with brain involvement, status post open brain biopsy currently on radiation. #Acute change of mental status at admission has improved.  However she just had another episode of acute confusion. Recommend consulting neurology for further evaluation.  Consider repeat CT scan or MRI for further evaluation. Suspect this may be secondary to parenchymal swelling due to radiation. Decadron 4 mg daily was started today.  Recommend to increase Decadron 4 mg to 3 times a day. She is already on Keppra 1000 mg twice daily.  Appreciate neurology input.  Family was updated.  I spoke with patient's mother at the bedside and also patient's husband over the phone.   Thank you for allowing me to participate in the care of this patient.  Total face to face encounter time for this patient visit was 70 min. >50% of the time was  spent in counseling and coordination of care.    Earlie Server, MD, PhD Hematology Oncology [facility] at [facility] Pager- [phone] 08/18/2019

## 2019-08-18 NOTE — Consult Note (Signed)
NAME: Vickie Waters  DOB: [birth year]  MRN: [id number]  Date/Time: 08/18/2019 5:56 PM  REQUESTING PROVIDER: Dr.Shah Subjective:  REASON FOR CONSULT: coag neg staph in blood culture No History from patient, chart reviewed and spoke to her husband and mother at [place] is a 53 y.o. 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 Post Op patient became lethargic and developed acute onset of left sided weakness. CT showed possible intratumoral hemorrhage. Sh was then taken back to  OR For removal of hemorrhagic tumor and decompression. The pathology was malignant melanoma- BRAF neg- she started cranial  radiation  07/26/19 She is now admitted with confusion, nausea and vomiting As per family after her surgery on 8/13 she has been getting radiation therapy to the brain.Marland KitchenLAst Friday she was painting her fire place and she could not smell the paint but her husband did and he felt it was very strong. On Sunday she was tired . On Monday she did not go for radiation as she was sleeping the whole day. On Tuesday morning husband noted vomitus next to her and called EMS and she was brought to the ED. As per her husband no fever, no headache . She missed her last dose of radiation on Monday. No tick bites or insect bites Her CT scan done in the ED showed Right temporal lobe mass resection with large pseudomeningocele overlying the cranioplasty plate. She was diagnosed as acute metabolic encephalopathy possibly multifactorial due to dehydration/cannabinoids She was started on IV fluids She improved and was more alert until today when she started to talk incoherently. She did have sandwich today according to her  mother.She was sent for MRI. I am asked to see patient as 1 of 4 bottle has coag neg staph   Past Medical History:  Diagnosis Date  . Allergy   . Asthma   . Cancer ([place])   . Depression   . Frequent headaches   . Hypertension   . Migraine     Past Surgical History:  Procedure Laterality Date  . CHOLECYSTECTOMY  2001    Social History   Socioeconomic History  . Marital status: Married    Spouse name: Not on file  . Number of children: Not on file  . Years of education: Not on file  . Highest education level: Not on file  Occupational History  . Not on file  Social Needs  . Financial resource strain: Not hard at all  . Food insecurity    Worry: Never true    Inability: Never true  . Transportation needs    Medical: No    Non-medical: No  Tobacco Use  . Smoking status: Current Every Day Smoker    Packs/day: 1.00    Types: Cigarettes  . Smokeless tobacco: Never Used  Substance and Sexual Activity  . Alcohol use: Yes    Alcohol/week: 0.0 standard drinks    Comment: social  . Drug use: Never  . Sexual activity: Not on file  Lifestyle  . Physical activity    Days per week: Patient refused    Minutes per session: Patient refused  . Stress: Not on file  Relationships  . Social Herbalist on phone: Patient refused    Gets  together: Patient refused    Attends religious service: Patient refused    Active member of club or organization: Patient refused    Attends meetings of clubs or organizations: Patient refused    Relationship status: Patient refused  . Intimate partner violence    Fear of current or ex partner: No    Emotionally abused: No    Physically abused: No    Forced sexual activity: No  Other Topics Concern  . Not on file  Social History Narrative   Married.   2 children   Works as an Scientist, water quality.   Enjoys spending time with her family.    Family History  Problem Relation Age of Onset  . Arthritis Mother   . Hypertension  Mother   . Hypertension Father   . Diabetes Father   . Mental illness Maternal Grandfather   . Breast cancer Neg Hx    Allergies  Allergen Reactions  . Cephalosporins Hives  . Penicillins Hives    ? Current Facility-Administered Medications  Medication Dose Route Frequency Provider Last Rate Last Dose  . 0.9 %  sodium chloride infusion   Intravenous Continuous Max Sane, MD 100 mL/hr at 08/18/19 1223    . acetaminophen (TYLENOL) tablet 650 mg  650 mg Oral Q6H PRN Max Sane, MD   650 mg at 08/18/19 0109   Or  . acetaminophen (TYLENOL) suppository 650 mg  650 mg Rectal Q6H PRN Max Sane, MD      . amLODipine (NORVASC) tablet 5 mg  5 mg Oral Daily Max Sane, MD   5 mg at 08/18/19 1137  . bisacodyl (DULCOLAX) EC tablet 5 mg  5 mg Oral Daily PRN Max Sane, MD      . dexamethasone (DECADRON) tablet 4 mg  4 mg Oral Girtha Rm, MD      . docusate sodium (COLACE) capsule 100 mg  100 mg Oral BID Max Sane, MD   100 mg at 08/18/19 1137  . enoxaparin (LOVENOX) injection 40 mg  40 mg Subcutaneous Q24H Manuella Ghazi, Vipul, MD      . feeding supplement (ENSURE ENLIVE) (ENSURE ENLIVE) liquid 237 mL  237 mL Oral TID BM Max Sane, MD      . HYDROcodone-acetaminophen (NORCO/VICODIN) 5-325 MG per tablet 1-2 tablet  1-2 tablet Oral Q4H PRN Max Sane, MD   1 tablet at 08/17/19 1659  . influenza vac split quadrivalent PF (FLUARIX) injection 0.5 mL  0.5 mL Intramuscular Tomorrow-1000 Manuella Ghazi, Vipul, MD      . levETIRAcetam (KEPPRA) tablet 1,000 mg  1,000 mg Oral BID Max Sane, MD   1,000 mg at 08/18/19 1139  . LORazepam (ATIVAN) 2 MG/ML injection           . metoprolol succinate (TOPROL-XL) 24 hr tablet 25 mg  25 mg Oral Daily Max Sane, MD   25 mg at 08/18/19 1138  . multivitamin with minerals tablet 1 tablet  1 tablet Oral Daily Max Sane, MD   1 tablet at 08/18/19 1137  . ondansetron (ZOFRAN) tablet 4 mg  4 mg Oral Q6H PRN Max Sane, MD       Or  . ondansetron (ZOFRAN) injection 4 mg  4 mg  Intravenous Q6H PRN Max Sane, MD      . ondansetron (ZOFRAN) tablet 8 mg  8 mg Oral BID Max Sane, MD   8 mg at 08/18/19 1138  . pantoprazole (PROTONIX) EC tablet 40 mg  40 mg Oral Daily Max Sane, MD  40 mg at 08/18/19 1136  . PARoxetine (PAXIL) tablet 20 mg  20 mg Oral Daily Max Sane, MD   20 mg at 08/18/19 1138  . pneumococcal 23 valent vaccine (PNU-IMMUNE) injection 0.5 mL  0.5 mL Intramuscular Tomorrow-1000 Max Sane, MD      . traZODone (DESYREL) tablet 25 mg  25 mg Oral QHS PRN Max Sane, MD         Abtx:  Anti-infectives (From admission, onward)   None      REVIEW OF SYSTEMS:  NA: Objective:  VITALS:  BP 129/81   Pulse 67   Temp 99.5 F (37.5 C)   Resp 18   Ht _0  (1.626 m)   Wt 86.7 kg   SpO2 96%   BMI 32.81 kg/m  PHYSICAL EXAM:  General: somnolent, opened eyes when I was examining her and tried to talk but went back to sleep Head:rt temporal  area surgical site well coapted Soft Swelling present     No discharge, no erythema  PERL  Cannot examine Neck: Supple, symmetrical, no adenopathy, thyroid: non tender no carotid bruit and no JVD. Back:did not examine. Lungs: b/l air entry Heart:s1s2 Abdomen: Soft, Extremities: atraumatic, no cyanosis. No edema. No clubbing Skin: No rashes or lesions. Or bruising Lymph: Cervical, supraclavicular normal. Neurologic: plantar equivocal- moves all 4 limbs spontaneously Pertinent Labs Lab Results CBC    Component Value Date/Time   WBC 12.8 (H) 08/18/2019 0344   RBC 4.57 08/18/2019 0344   HGB 13.7 08/18/2019 0344   HCT 41.3 08/18/2019 0344   HCT 45.1 11/29/2012 0955   PLT 168 08/18/2019 0344   MCV 90.4 08/18/2019 0344   MCH 30.0 08/18/2019 0344   MCHC 33.2 08/18/2019 0344   RDW 13.0 08/18/2019 0344   LYMPHSABS 1.4 08/16/2019 0748   MONOABS 1.0 08/16/2019 0748   EOSABS 0.3 08/16/2019 0748   BASOSABS 0.0 08/16/2019 0748    CMP Latest Ref Rng & Units 08/18/2019 08/17/2019 08/16/2019  Glucose  70 - 99 mg/dL 96 122(H) 120(H)  BUN 6 - 20 mg/dL 18 21(H) 29(H)  Creatinine 0.44 - 1.00 mg/dL 0.82 0.66 0.94  Sodium 135 - 145 mmol/L 138 140 140  Potassium 3.5 - 5.1 mmol/L 4.0 4.3 3.8  Chloride 98 - 111 mmol/L 103 104 99  CO2 22 - 32 mmol/L _1 Calcium 8.9 - 10.3 mg/dL 8.9 9.0 9.1  Total Protein 6.5 - 8.1 g/dL - - 6.9  Total Bilirubin 0.3 - 1.2 mg/dL - - 1.3(H)  Alkaline Phos 38 - 126 U/L - - 65  AST 15 - 41 U/L - - 24  ALT 0 - 44 U/L - - 39      Microbiology: Recent Results (from the past 240 hour(s))  SARS CORONAVIRUS 2 (TAT 6-24 HRS) Nasopharyngeal Nasopharyngeal Swab     Status: None   Collection Time: 08/16/19  7:48 AM   Specimen: Nasopharyngeal Swab  Result Value Ref Range Status   SARS Coronavirus 2 NEGATIVE NEGATIVE Final    Comment: (NOTE) SARS-CoV-2 target nucleic acids are NOT DETECTED. The SARS-CoV-2 RNA is generally detectable in upper and lower respiratory specimens during the acute phase of infection. Negative results do not preclude SARS-CoV-2 infection, do not rule out co-infections with other pathogens, and should not be used as the sole basis for treatment or other patient management decisions. Negative results must be combined with clinical observations, patient history, and epidemiological information. The expected result is Negative. Fact Sheet for Patients: SugarRoll.be  Fact Sheet for Healthcare Providers: https://www.woods-mathews.com/ This test is not yet approved or cleared by the [place] FDA and  has been authorized for detection and/or diagnosis of SARS-CoV-2 by FDA under an Emergency Use Authorization (EUA). This EUA will remain  in effect (meaning this test can be used) for the duration of the COVID-19 declaration under Section 56 4(b)(1) of the Act, 21 U.S.C. section 360bbb-3(b)(1), unless the authorization is terminated or revoked sooner. Performed at [facility], [place]  [address], [place], [place] [address]   Blood Culture (routine x 2)     Status: Abnormal   Collection Time: 08/16/19  7:48 AM   Specimen: BLOOD  Result Value Ref Range Status   Specimen Description   Final    BLOOD LEFT ANTECUBITAL Performed at [facility], [address] [place], [place] [address]    Special Requests   Final    BOTTLES DRAWN AEROBIC AND ANAEROBIC Blood Culture results may not be optimal due to an excessive volume of blood received in culture bottles Performed at [facility], [address], [place], [place] [address]    Culture  Setup Time   Final    GRAM POSITIVE COCCI AEROBIC BOTTLE ONLY CRITICAL RESULT CALLED TO, READ BACK BY AND VERIFIED WITH: SCOTT HALL AT [place] ON 08/17/2019 [place].    Culture (A)  Final    STAPHYLOCOCCUS SPECIES (COAGULASE NEGATIVE) THE SIGNIFICANCE OF ISOLATING THIS ORGANISM FROM A SINGLE SET OF BLOOD CULTURES WHEN MULTIPLE SETS ARE DRAWN IS UNCERTAIN. PLEASE NOTIFY THE MICROBIOLOGY DEPARTMENT WITHIN ONE WEEK IF SPECIATION AND SENSITIVITIES ARE REQUIRED. Performed at [facility], [place] [address] [place], [place] [address]    Report Status 08/18/2019 FINAL  Final  Blood Culture ID Panel (Reflexed)     Status: Abnormal   Collection Time: 08/16/19  7:48 AM  Result Value Ref Range Status   Enterococcus species NOT DETECTED NOT DETECTED Final   Listeria monocytogenes NOT DETECTED NOT DETECTED Final   Staphylococcus species DETECTED (A) NOT DETECTED Final    Comment: Methicillin (oxacillin) resistant coagulase negative staphylococcus. Possible blood culture contaminant (unless isolated from more than one blood culture draw or clinical case suggests pathogenicity). No antibiotic treatment is indicated for blood  culture contaminants. CRITICAL RESULT CALLED TO, READ BACK BY AND VERIFIED WITH: SCOTT HALL AT 0701 ON 08/17/2019 [place].    Staphylococcus aureus (BCID) NOT DETECTED NOT DETECTED Final   Methicillin resistance DETECTED  (A) NOT DETECTED Final    Comment: CRITICAL RESULT CALLED TO, READ BACK BY AND VERIFIED WITH: SCOTT HALL AT 0701 ON 08/17/2019 [place].    Streptococcus species NOT DETECTED NOT DETECTED Final   Streptococcus agalactiae NOT DETECTED NOT DETECTED Final   Streptococcus pneumoniae NOT DETECTED NOT DETECTED Final   Streptococcus pyogenes NOT DETECTED NOT DETECTED Final   Acinetobacter baumannii NOT DETECTED NOT DETECTED Final   Enterobacteriaceae species NOT DETECTED NOT DETECTED Final   Enterobacter cloacae complex NOT DETECTED NOT DETECTED Final   Escherichia coli NOT DETECTED NOT DETECTED Final   Klebsiella oxytoca NOT DETECTED NOT DETECTED Final   Klebsiella pneumoniae NOT DETECTED NOT DETECTED Final   Proteus species NOT DETECTED NOT DETECTED Final   Serratia marcescens NOT DETECTED NOT DETECTED Final   Haemophilus influenzae NOT DETECTED NOT DETECTED Final   Neisseria meningitidis NOT DETECTED NOT DETECTED Final   Pseudomonas aeruginosa NOT DETECTED NOT DETECTED Final   Candida albicans NOT DETECTED NOT DETECTED Final   Candida glabrata NOT  DETECTED NOT DETECTED Final   Candida krusei NOT DETECTED NOT DETECTED Final   Candida parapsilosis NOT DETECTED NOT DETECTED Final   Candida tropicalis NOT DETECTED NOT DETECTED Final    Comment: Performed at [facility], [address] [place], [place] [address]  Blood Culture (routine x 2)     Status: None (Preliminary result)   Collection Time: 08/16/19  7:49 AM   Specimen: BLOOD  Result Value Ref Range Status   Specimen Description BLOOD LEFT HAND  Final   Special Requests   Final    BOTTLES DRAWN AEROBIC AND ANAEROBIC Blood Culture adequate volume   Culture   Final    NO GROWTH 2 DAYS Performed at [facility], [address], [place], [place] [address]    Report Status PENDING  Incomplete    IMAGING RESULTS:    Postsurgical resection of large right temporal lobe mass. Surgicalfluid collection in the right temporal  lobe extends through the craniotomy defect into the subgaleal tissues compatible with pseudomeningocele. Irregular enhancement of the leptomeninges has progressed significantly since 06/08/2019 compatible with leptomeningeal carcinomatosis. I have personally reviewed the films ? Impression/Recommendation  Coag neg staph 1 of 4 bottle is likely a skin contaminant.  ? Encephalopathy-likely due to worsening Leptomeningeal carcinomatosis as evidenced on MRI Could it be seizures/post ictal?  could this be due to whole brain radiation induced cerebral edema   IS it due to compression complications from the rt pseudomeningocele following tumor removal   Is it made worse because of ativan?  CNS Infection is less likely because of the clinical picture, fluctuating course but should always be in the D.D. No breach in the skin over the temporal area collection nor there is any discharge from the surgical scar to raise suspicion .   She is currently on decadron 49m TID  Seen by oncologist Dr.Zhou- she recommended neurology consult  Discussed the management with her husband and mother and her nurse   ?

## 2019-08-18 NOTE — Progress Notes (Signed)
[place] [place] NAME: Vickie Waters    MR#:  [id number]  DATE OF BIRTH:  [birth year]  SUBJECTIVE:  CHIEF COMPLAINT:   Chief Complaint  Patient presents with  . Altered Mental Status  Patient is feeling much better, no nausea or vomiting.  Sitting in the chair wanting to eat breakfast, low grade fever REVIEW OF SYSTEMS:  Review of Systems  Constitutional: Positive for fever and malaise/fatigue. Negative for diaphoresis and weight loss.  HENT: Negative for ear discharge, ear pain, hearing loss, nosebleeds, sore throat and tinnitus.   Eyes: Negative for blurred vision and pain.  Respiratory: Negative for cough, hemoptysis, shortness of breath and wheezing.   Cardiovascular: Negative for chest pain, palpitations, orthopnea and leg swelling.  Gastrointestinal: Negative for abdominal pain, blood in stool, constipation, diarrhea, heartburn, nausea and vomiting.  Genitourinary: Negative for dysuria, frequency and urgency.  Musculoskeletal: Negative for back pain and myalgias.  Skin: Negative for itching and rash.  Neurological: Negative for dizziness, tingling, tremors, focal weakness, seizures, weakness and headaches.  Psychiatric/Behavioral: Negative for depression. The patient is not nervous/anxious.     DRUG ALLERGIES:   Allergies  Allergen Reactions  . Cephalosporins Hives  . Penicillins Hives   VITALS:  Blood pressure 125/80, pulse 62, temperature 99 F (37.2 C), temperature source Oral, resp. rate 20, height 5\' 4"  (1.626 m), weight 86.7 kg, SpO2 96 %. PHYSICAL EXAMINATION:  Physical Exam HENT:     Head: Normocephalic and atraumatic.  Eyes:     Conjunctiva/sclera: Conjunctivae normal.     Pupils: Pupils are equal, round, and reactive to light.  Neck:     Musculoskeletal: Normal range of motion and neck supple.     Thyroid: No thyromegaly.     Trachea: No tracheal deviation.  Cardiovascular:     Rate and Rhythm:  Normal rate and regular rhythm.     Heart sounds: Normal heart sounds.  Pulmonary:     Effort: Pulmonary effort is normal. No respiratory distress.     Breath sounds: Normal breath sounds. No wheezing.  Chest:     Chest wall: No tenderness.  Abdominal:     General: Bowel sounds are normal. There is no distension.     Palpations: Abdomen is soft.     Tenderness: There is no abdominal tenderness.  Musculoskeletal: Normal range of motion.  Skin:    General: Skin is warm and dry.     Findings: No rash.  Neurological:     Mental Status: She is alert and oriented to person, place, and time.     Cranial Nerves: No cranial nerve deficit.    LABORATORY PANEL:  Female CBC Recent Labs  Lab 08/18/19 0344  WBC 12.8*  HGB 13.7  HCT 41.3  PLT 168   ------------------------------------------------------------------------------------------------------------------ Chemistries  Recent Labs  Lab 08/16/19 0748  08/18/19 0344  NA 140   < > 138  K 3.8   < > 4.0  CL 99   < > 103  CO2 30   < > 27  GLUCOSE 120*   < > 96  BUN 29*   < > 18  CREATININE 0.94   < > 0.82  CALCIUM 9.1   < > 8.9  AST 24  --   --   ALT 39  --   --   ALKPHOS 65  --   --   BILITOT 1.3*  --   --    < > =  values in this interval not displayed.   RADIOLOGY:  No results found. 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  -Followed by Dr.  Grayland Ormond -getting chemo, XRT and may be immunotherapy -Next follow-up appointment at cancer clinic on Thursday, October 8 initiate cycle 1 of combination immunotherapy.  -Brain biopsy: Patient has appointment at [facility] on August 23, 2019 for continued postsurgical follow-up. - CT head shows Right temporal lobe mass resection with large pseudomeningocele overlying the cranioplasty plate  * 1/4 bottles staph species, methicillin resistant -likely false-positive, skin contaminant -Considering immunocompromise state and low-grade fever (T-max 100.1) we will observe her for at least another 1 to 2 days for other blood culture to result to make sure we are not dealing with real bacteremia -Consult infectious disease   All the records are reviewed and case discussed with Care Management/Social Worker. Management plans discussed with the patient, family (discussed with husband Vickie Waters [id number] and they are in agreement.  CODE STATUS: Full Code  TOTAL TIME TAKING CARE OF THIS PATIENT: 35 minutes.   More than 50% of the time was spent in counseling/coordination of care: YES  POSSIBLE D/C IN 1-2 DAYS, DEPENDING ON CLINICAL CONDITION.   Max Sane M.D on 08/18/2019 at 10:07 AM  Between 7am to 6pm - Pager - [phone]  After 6pm go to [email or web address] - Proofreader  [facility]  Office  [phone]  CC: Primary care physician; Pleas Koch, NP  Note: This dictation was prepared with Dragon dictation along with smaller phrase technology. Any transcriptional errors that result from this process are unintentional.

## 2019-08-18 NOTE — Evaluation (Signed)
Physical Therapy Evaluation Patient Details Name: Vickie Waters MRN: [id number] DOB: 04/05/1966 Today's Date: 08/18/2019   History of Present Illness  Per MD H&P: Pt is a 53 y.o. female with a known history of metastatic melanoma is being admitted for nausea, vomiting and altered mental status. She was brought in by [facility] from home, complaints of N/V x 2 days.  Per husband, patient woke up with altered mental status this morning, alert to self only upon arrival.  Pt w/ brain cancer; missed last radiation treatment due to being sick.  She reports not been feeling well last few days.  She knows the year, not quite sure what month. Sleeping a lot last few days.  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    Clinical Impression  Pt presented with min deficits in strength, transfers, gait, balance, and activity tolerance but overall performed well during the session.  The pt was Ind with bed mobility tasks and SBA with transfers demonstrating good eccentric and concentric control from various height surfaces.  Pt was able to amb 200' with SBA with min drifting left/right during head turns but did not require physical assistance to prevent LOB.  Of note pt did present with impulsiveness at times during the session with mobility and several times attempted to transfer or ambulate prior to PT being ready even with cues for pt to wait.  Otherwise the pt was conversational, able to provide an accurate history with mother in the room to verify, and followed most commands well.  Pt will benefit from HHPT services upon discharge to safely address above deficits for decreased caregiver assistance and eventual return to PLOF.      Follow Up Recommendations Home health PT;Other (comment)(Pt was receiving HHPT with therapy stating would drop out while pt was receiving radiation  to save visits for when radiation was complete)    Equipment Recommendations  None recommended by PT;Other (comment)(Anticipate no AD needs but will watch closely while pt in acute care)    Recommendations for Other Services       Precautions / Restrictions Precautions Precautions: Fall Restrictions Weight Bearing Restrictions: No      Mobility  Bed Mobility Overal bed mobility: Independent                Transfers Overall transfer level: Needs assistance Equipment used: None Transfers: Sit to/from Stand Sit to Stand: Supervision         General transfer comment: Good eccentric and concentric control with transfers  Ambulation/Gait Ambulation/Gait assistance: Supervision Gait Distance (Feet): 200 Feet Assistive device: None Gait Pattern/deviations: Step-through pattern;Drifts right/left;Decreased step length - right;Decreased step length - left Gait velocity: decreased   General Gait Details: Slow cadence with short B step length and min drifting left/right but no LOB  Stairs            Wheelchair Mobility    Modified Rankin (Stroke Patients Only)       Balance Overall balance assessment: Needs assistance   Sitting balance-Leahy Scale: Good     Standing balance support: No upper extremity supported Standing balance-Leahy Scale: Good                               Pertinent Vitals/Pain Pain Assessment: No/denies pain    Home Living Family/patient expects to be discharged to::  Private residence Living Arrangements: Spouse/significant other;Children Available Help at Discharge: Family;Available 24 hours/day Type of Home: House Home Access: Level entry     Home Layout: One level Home Equipment: None      Prior Function Level of Independence: Independent         Comments: Ind amb community distances without an AD, no fall history, Ind with ADLs     Hand Dominance        Extremity/Trunk Assessment   Upper  Extremity Assessment Upper Extremity Assessment: Generalized weakness    Lower Extremity Assessment Lower Extremity Assessment: Generalized weakness       Communication   Communication: No difficulties  Cognition Arousal/Alertness: Awake/alert Behavior During Therapy: WFL for tasks assessed/performed Overall Cognitive Status: Within Functional Limits for tasks assessed                                 General Comments: Pt A&O and able to follow commands but was somewhat impulsive with mobiltiy often requiring multiple cues for general safety      General Comments      Exercises Total Joint Exercises Ankle Circles/Pumps: Strengthening;Both;10 reps Quad Sets: Strengthening;Both;10 reps Gluteal Sets: Strengthening;Both;10 reps Heel Slides: AROM;Both;5 reps Hip ABduction/ADduction: AROM;Both;5 reps Straight Leg Raises: AROM;Both;5 reps Long Arc Quad: Strengthening;Both;10 reps Knee Flexion: Strengthening;Both;10 reps Marching in Standing: AROM;5 reps;Both Other Exercises Other Exercises: HEP education for BLE APs, QS, and GS x 10 each 5-6x/day   Assessment/Plan    PT Assessment Patient needs continued PT services  PT Problem List Decreased strength;Decreased activity tolerance;Decreased balance       PT Treatment Interventions Gait training;DME instruction;Functional mobility training;Therapeutic activities;Therapeutic exercise;Balance training;Patient/family education;Stair training    PT Goals (Current goals can be found in the Care Plan section)  Acute Rehab PT Goals Patient Stated Goal: To get back home and to be able to do what I used to do PT Goal Formulation: With patient Time For Goal Achievement: 08/31/19 Potential to Achieve Goals: Good    Frequency Min 2X/week   Barriers to discharge        Co-evaluation               AM-PAC PT "6 Clicks" Mobility  Outcome Measure Help needed turning from your back to your side while in a flat  bed without using bedrails?: None Help needed moving from lying on your back to sitting on the side of a flat bed without using bedrails?: None Help needed moving to and from a bed to a chair (including a wheelchair)?: A Little Help needed standing up from a chair using your arms (e.g., wheelchair or bedside chair)?: A Little Help needed to walk in hospital room?: A Little Help needed climbing 3-5 steps with a railing? : A Little 6 Click Score: 20    End of Session Equipment Utilized During Treatment: Gait belt Activity Tolerance: Patient tolerated treatment well Patient left: Other (comment)(Pt taken for imaging at end of session) Nurse Communication: Mobility status PT Visit Diagnosis: Unsteadiness on feet (R26.81);Difficulty in walking, not elsewhere classified (R26.2);Muscle weakness (generalized) (M62.81)    Time: [id number] PT Time Calculation (min) (ACUTE ONLY): 25 min   Charges:   PT Evaluation $PT Eval Moderate Complexity: 1 Mod PT Treatments $Therapeutic Exercise: 8-22 mins        D. Scott Juandavid Dallman PT, DPT 08/18/19, 10:54 AM

## 2019-08-19 ENCOUNTER — Inpatient Hospital Stay: Payer: Managed Care, Other (non HMO)

## 2019-08-19 ENCOUNTER — Ambulatory Visit: Payer: Managed Care, Other (non HMO)

## 2019-08-19 ENCOUNTER — Ambulatory Visit
Admission: RE | Admit: 2019-08-19 | Discharge: 2019-08-19 | Disposition: A | Discharge status: Home / Self Care | Payer: Managed Care, Other (non HMO) | Source: Ambulatory Visit | Attending: Radiation Oncology | Admitting: Radiation Oncology

## 2019-08-19 DIAGNOSIS — R410 Disorientation, unspecified: Secondary | ICD-10-CM

## 2019-08-19 DIAGNOSIS — R509 Fever, unspecified: Secondary | ICD-10-CM

## 2019-08-19 DIAGNOSIS — C7949 Secondary malignant neoplasm of other parts of nervous system: Principal | ICD-10-CM

## 2019-08-19 DIAGNOSIS — C801 Malignant (primary) neoplasm, unspecified: Secondary | ICD-10-CM

## 2019-08-19 DIAGNOSIS — Z515 Encounter for palliative care: Secondary | ICD-10-CM

## 2019-08-19 DIAGNOSIS — T361X5A Adverse effect of cephalosporins and other beta-lactam antibiotics, initial encounter: Secondary | ICD-10-CM

## 2019-08-19 DIAGNOSIS — L500 Allergic urticaria: Secondary | ICD-10-CM

## 2019-08-19 LAB — BASIC METABOLIC PANEL
Anion gap: 8 (ref 5–15)
BUN: 14 mg/dL (ref 6–20)
CO2: 24 mmol/L (ref 22–32)
Calcium: 9 mg/dL (ref 8.9–10.3)
Chloride: 106 mmol/L (ref 98–111)
Creatinine, Ser: 0.71 mg/dL (ref 0.44–1.00)
GFR calc Af Amer: >60 mL/min (ref >60)
GFR calc non Af Amer: >60 mL/min (ref >60)
Glucose, Bld: 139 mg/dL — ABNORMAL HIGH (ref 70–99)
Potassium: 4.1 mmol/L (ref 3.5–5.1)
Sodium: 138 mmol/L (ref 135–145)

## 2019-08-19 LAB — CBC
HCT: 43.9 % (ref 36.0–46.0)
Hemoglobin: 14.5 g/dL (ref 12.0–15.0)
MCH: 29.9 pg (ref 26.0–34.0)
MCHC: 33 g/dL (ref 30.0–36.0)
MCV: 90.5 fL (ref 80.0–100.0)
Platelets: 148 10*3/uL — ABNORMAL LOW (ref 150–400)
RBC: 4.85 MIL/uL (ref 3.87–5.11)
RDW: 12.6 % (ref 11.5–15.5)
WBC: 12.5 10*3/uL — ABNORMAL HIGH (ref 4.0–10.5)
nRBC: 0 % (ref 0.0–0.2)

## 2019-08-19 LAB — URINALYSIS, COMPLETE (UACMP) WITH MICROSCOPIC
Bacteria, UA: NONE SEEN
Bilirubin Urine: NEGATIVE
Glucose, UA: NEGATIVE mg/dL
Hgb urine dipstick: NEGATIVE
Ketones, ur: NEGATIVE mg/dL
Leukocytes,Ua: NEGATIVE
Nitrite: NEGATIVE
Protein, ur: NEGATIVE mg/dL
Specific Gravity, Urine: 1.01 (ref 1.005–1.030)
WBC, UA: NONE SEEN WBC/hpf (ref 0–5)
pH: 7 (ref 5.0–8.0)

## 2019-08-19 LAB — CRYPTOCOCCAL ANTIGEN: Crypto Ag: NEGATIVE

## 2019-08-19 LAB — GLUCOSE, CAPILLARY: Glucose-Capillary: 126 mg/dL — ABNORMAL HIGH (ref 70–99)

## 2019-08-19 MED ORDER — DIPHENHYDRAMINE HCL 12.5 MG/5ML PO ELIX
12.5000 mg | ORAL_SOLUTION | Freq: Once | ORAL | Status: AC
  Administered 2019-08-19: 12.5 mg via ORAL
  Filled 2019-08-19: qty 5

## 2019-08-19 MED ORDER — PAROXETINE HCL 20 MG PO TABS
20.0000 mg | ORAL_TABLET | Freq: Every day | ORAL | Status: DC
  Administered 2019-08-19 – 2019-08-22 (×4): 20 mg via ORAL
  Filled 2019-08-19 (×4): qty 1

## 2019-08-19 MED ORDER — SODIUM CHLORIDE 0.9% FLUSH: 10.0000 mL | INTRAVENOUS | Status: DC | PRN

## 2019-08-19 MED ORDER — DEXTROSE 5 % IV SOLN
10.0000 mg/kg | Freq: Three times a day (TID) | INTRAVENOUS | Status: DC
  Filled 2019-08-19 (×3): qty 13.5

## 2019-08-19 MED ORDER — DEXTROSE 5 % IV SOLN
10.0000 mg/kg | Freq: Three times a day (TID) | INTRAVENOUS | Status: DC
  Filled 2019-08-19 (×5): qty 13.5

## 2019-08-19 MED ORDER — VANCOMYCIN HCL IN DEXTROSE 1-5 GM/200ML-% IV SOLN: 1000.0000 mg | INTRAVENOUS | Status: DC

## 2019-08-19 MED ORDER — SODIUM CHLORIDE 0.9 % IV SOLN
2.0000 g | Freq: Three times a day (TID) | INTRAVENOUS | Status: DC
  Filled 2019-08-19 (×4): qty 2

## 2019-08-19 MED ORDER — DIPHENHYDRAMINE HCL 12.5 MG/5ML PO ELIX
12.5000 mg | ORAL_SOLUTION | Freq: Three times a day (TID) | ORAL | Status: AC | PRN
  Administered 2019-08-20: 12.5 mg via ORAL
  Filled 2019-08-19 (×2): qty 5

## 2019-08-19 MED ORDER — VANCOMYCIN HCL 10 G IV SOLR
2000.0000 mg | Freq: Once | INTRAVENOUS | Status: AC
  Administered 2019-08-19: 2000 mg via INTRAVENOUS
  Filled 2019-08-19: qty 2000

## 2019-08-19 MED ORDER — SODIUM CHLORIDE 0.9 % IV SOLN
2.0000 g | Freq: Three times a day (TID) | INTRAVENOUS | Status: DC
  Filled 2019-08-19 (×3): qty 2

## 2019-08-19 MED ORDER — VANCOMYCIN HCL 1.25 G IV SOLR
1250.0000 mg | INTRAVENOUS | Status: DC
  Administered 2019-08-20 – 2019-08-21 (×2): 1250 mg via INTRAVENOUS
  Filled 2019-08-19 (×2): qty 1250

## 2019-08-19 MED ORDER — SODIUM CHLORIDE 0.9 % IV SOLN
2.0000 g | Freq: Three times a day (TID) | INTRAVENOUS | Status: DC
  Administered 2019-08-19: 2 g via INTRAVENOUS
  Filled 2019-08-19 (×3): qty 2

## 2019-08-19 NOTE — Progress Notes (Signed)
eeg completed ° °

## 2019-08-19 NOTE — Progress Notes (Addendum)
VAST consulted to place 2nd IV access d/t multiple meds and fluids. Assessed pt's lower and upper arms bilaterally. Bilateral basilic veins too deep to access with USGIV. Otherwise, no appropriate vessels for USGIV placement visualized. This RN does not have competency to place midline. IVT consult will be placed at shift change for night shift IV nurse to attempt access. Also discussed with pt's nurse, Ok Edwards, the possibility of patient needing a PICC or CL if long term access is needed, d/t patient's poor vasculature.

## 2019-08-19 NOTE — Procedures (Signed)
Patient Name: Vickie Waters  MRN: [id number]  Epilepsy Attending: Lora Havens  Referring Physician/Provider: Dr Leotis Pain Date: 08/19/2019  Duration: 34.56 mins  Patient history: 53yo F with right temporal lobectomy presented with ams. EEG to evaluate for seizure.  Level of alertness: awake/lethargic, drowsy  AEDs during EEG study: keppra  Technical aspects: This EEG study was done with scalp electrodes positioned according to the 10-20 International system of electrode placement. Electrical activity was acquired at a sampling rate of 500Hz  and reviewed with a high frequency filter of 70Hz  and a low frequency filter of 1Hz . EEG data were recorded continuously and digitally stored.   DESCRIPTION: EEG showed continuous generalized 2-6hz  theta-delta slowing, maximal right frontotemporal region. Rare sharp transients were seen in left frontotemporal region. No clear posterior dominant rhythm was seen. Photic driving was not seen during photic stimulation. Hyperventilation was not performed.  ABNORMALITY: 1. Continuous slow, generalized, maximal right frontotemporal  IMPRESSION: This study is suggestive of cortical dysfunction in right frontotemporal region likely secondary to underlying craniotomy and temporal lobectomy. Additionally, there was moderate to severe diffuse encephalopathy, non specific to etiology. No seizures or definite epileptiform discharges were seen throughout the recording.    Vickie Waters

## 2019-08-19 NOTE — Consult Note (Signed)
Date of Admission:  08/16/2019        Subjective: Pt awake and alert Oriented in person, place, year, month and day Some confusion in between follows command well Says she had a fever Is having some itching No headache  Medications:  . amLODipine  5 mg Oral Daily  . dexamethasone (DECADRON) injection  4 mg Intravenous Q8H  . docusate sodium  100 mg Oral BID  . enoxaparin (LOVENOX) injection  40 mg Subcutaneous Q24H  . feeding supplement (ENSURE ENLIVE)  237 mL Oral TID BM  . influenza vac split quadrivalent PF  0.5 mL Intramuscular Tomorrow-1000  . metoprolol succinate  25 mg Oral Daily  . multivitamin with minerals  1 tablet Oral Daily  . ondansetron  8 mg Oral BID  . pantoprazole  40 mg Oral Daily  . PARoxetine  20 mg Oral Daily  . pneumococcal 23 valent vaccine  0.5 mL Intramuscular Tomorrow-1000    Objective: Vital signs in last 24 hours: Temp:  [98.6 F (37 C)-101.7 F (38.7 C)] 99.3 F (37.4 C) (10/02 0015) Pulse Rate:  [56-77] 56 (10/02 0400) Resp:  [15-18] 15 (10/01 1917) BP: (118-156)/(81-121) 144/94 (10/02 0400) SpO2:  [96 %-100 %] 98 % (10/01 2150) Weight:  [86.4 kg] 86.4 kg (10/02 0500)  PHYSICAL EXAM:  General: Alert, cooperative, no distress, appears stated age. Face flushed Head: rt side temporal swelling near the surgical scar Eyes: PERL, injection of conjuctiva, full range of eye movts, no diplopia ENT Nares normal. No drainage or sinus tenderness. Lips, mucosa, and tongue normal. No Thrush Neck: Supple, symmetrical, no adenopathy, thyroid: non tender no carotid bruit and no JVD. Back: No CVA tenderness. Lungs: Clear to auscultation bilaterally. No Wheezing or Rhonchi. No rales. Heart: Regular rate and rhythm, no murmur, rub or gallop. Abdomen: Soft, Urticarial rash over the abdomen and left breast        Extremities: atraumatic, no cyanosis. No edema. No clubbing Skin: No rashes or lesions. Or bruising Lymph: Cervical,  supraclavicular normal. Neurologic: Grossly non-focal  Lab Results Recent Labs    08/18/19 0344 08/19/19 0446  WBC 12.8* 12.5*  HGB 13.7 14.5  HCT 41.3 43.9  NA 138 138  K 4.0 4.1  CL 103 106  CO2 27 24  BUN 18 14  CREATININE 0.82 0.71   Liver Panel No results for input(s): PROT, ALBUMIN, AST, ALT, ALKPHOS, BILITOT, BILIDIR, IBILI in the last 72 hours. Sedimentation Rate No results for input(s): ESRSEDRATE in the last 72 hours. C-Reactive Protein No results for input(s): CRP in the last 72 hours.  Microbiology:  Studies/Results: [name] Wo Contrast  Result Date: 08/18/2019 CLINICAL DATA:  Metastatic melanoma. Postop craniotomy. Altered mental status. EXAM: MRI HEAD WITHOUT AND WITH CONTRAST TECHNIQUE: Multiplanar, multiecho pulse sequences of the brain and surrounding structures were obtained without and with intravenous contrast. CONTRAST:  68mL GADAVIST GADOBUTROL 1 MMOL/ML IV SOLN COMPARISON:  CT head 08/16/2019.  MRI head with contrast 06/08/2019 FINDINGS: Brain: Resection of tumor mass in the right temporal lobe since the prior MRI. Surgical encephalomalacia in the right inferior and anterior temporal lobe with irregular enhancement of the post surgical cavity in the right temporal lobe. Fluid extends through the craniotomy defect into the subgaleal tissues where there is a fluid collection measuring approximately 6.2 x 3.0 cm. 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 Significant progression in leptomeningeal enhancement diffusely and bilaterally. This also extends into the posterior fossa. Ventricle size normal.  No acute infarct.  No midline shift. Vascular: Normal arterial flow voids Skull and upper cervical spine: Right temporal craniotomy with pseudomeningocele as described above. Sinuses/Orbits: Negative Other: None IMPRESSION: Postsurgical resection of large right temporal lobe mass. Surgical fluid collection in the right temporal lobe extends  through the craniotomy defect into the subgaleal tissues compatible with pseudomeningocele. Irregular enhancement of the leptomeninges has progressed significantly since 06/08/2019 compatible with leptomeningeal carcinomatosis. Electronically Signed   By: Franchot Gallo M.D.   On: 08/18/2019 17:45   Dg Chest Port 1 View  Result Date: 08/18/2019 CLINICAL DATA:  Somnolence. EXAM: PORTABLE CHEST 1 VIEW COMPARISON:  Radiograph 08/16/2019. PET CT 07/20/2019 FINDINGS: The cardiomediastinal contours are unchanged, upper normal heart size. No pulmonary edema. Slight increase in bibasilar atelectasis from prior. No consolidation, pleural effusion, or pneumothorax. No acute osseous abnormalities are seen. IMPRESSION: Slight increase in bibasilar atelectasis from prior. Otherwise no acute findings. Electronically Signed   By: Keith Rake M.D.   On: 08/18/2019 22:47     Assessment/Plan:   ? 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 Cryptococcal antigen negative -   Fever - is it due to underlying malignancy or radiation induced? Concern is whether the pseudomeningocele is infected? Will continue vancomycin Will DC meropenem because of urticaria Will not add gram negative coverage now- if needed will  add quinolone She is currently on decadron 4mg  TID  Coag neg staph 1 of 4 bottle is likely a skin contaminant.  But because of fever she is getting vanco  Repeat blood culture sent   Urticaria 3 hrs after getting Meropenem- IgE response- stopped the medicine and gave benadryl In the future if a cephalosporin,  penicillin or meropenem is needed she will have to be desensitized  Discussed the management with her and her mother and her nurse  [facility] on call this weekend I will follow her remotely

## 2019-08-19 NOTE — Progress Notes (Signed)
[facility] [place] NAME: Vickie Waters    MR#:  [id number]  DATE OF BIRTH:  1965-12-25  SUBJECTIVE:  CHIEF COMPLAINT:   Chief Complaint  Patient presents with   Altered Mental Status  Acute change in mental status last evening, husband at bedside, patient sleepy/lethargic REVIEW OF SYSTEMS:  ROS unable to obtain due to patient's mental status DRUG ALLERGIES:   Allergies  Allergen Reactions   Cephalosporins Hives   Penicillins Hives   VITALS:  Blood pressure (!) 144/94, pulse (!) 56, temperature 99.3 F (37.4 C), temperature source Axillary, resp. rate 15, height 5\' 4"  (1.626 m), weight 86.4 kg, SpO2 98 %. PHYSICAL EXAMINATION:  Physical Exam HENT:     Head: Normocephalic and atraumatic.  Eyes:     Conjunctiva/sclera: Conjunctivae normal.     Pupils: Pupils are equal, round, and reactive to light.  Neck:     Musculoskeletal: Normal range of motion and neck supple.     Thyroid: No thyromegaly.     Trachea: No tracheal deviation.  Cardiovascular:     Rate and Rhythm: Normal rate and regular rhythm.     Heart sounds: Normal heart sounds.  Pulmonary:     Effort: Pulmonary effort is normal. No respiratory distress.     Breath sounds: Normal breath sounds. No wheezing.  Chest:     Chest wall: No tenderness.  Abdominal:     General: Bowel sounds are normal. There is no distension.     Palpations: Abdomen is soft.     Tenderness: There is no abdominal tenderness.  Musculoskeletal: Normal range of motion.  Skin:    General: Skin is warm and dry.     Findings: No rash.  Neurological:     Mental Status: She is lethargic.     Cranial Nerves: No cranial nerve deficit.    LABORATORY PANEL:  Female CBC Recent Labs  Lab 08/19/19 0446  WBC 12.5*  HGB 14.5  HCT 43.9  PLT 148*   ------------------------------------------------------------------------------------------------------------------ Chemistries  Recent  Labs  Lab 08/16/19 0748  08/19/19 0446  NA 140   < > 138  K 3.8   < > 4.1  CL 99   < > 106  CO2 30   < > 24  GLUCOSE 120*   < > 139*  BUN 29*   < > 14  CREATININE 0.94   < > 0.71  CALCIUM 9.1   < > 9.0  AST 24  --   --   ALT 39  --   --   ALKPHOS 65  --   --   BILITOT 1.3*  --   --    < > = values in this interval not displayed.   RADIOLOGY:  [name] [id number] Contrast  Result Date: 08/18/2019 CLINICAL DATA:  Metastatic melanoma. Postop craniotomy. Altered mental status. EXAM: MRI HEAD WITHOUT AND WITH CONTRAST TECHNIQUE: Multiplanar, multiecho pulse sequences of the brain and surrounding structures were obtained without and with intravenous contrast. CONTRAST:  57mL GADAVIST GADOBUTROL 1 MMOL/ML IV SOLN COMPARISON:  CT head 08/16/2019.  MRI head with contrast 06/08/2019 FINDINGS: Brain: Resection of tumor mass in the right temporal lobe since the prior MRI. Surgical encephalomalacia in the right inferior and anterior temporal lobe with irregular enhancement of the post surgical cavity in the right temporal lobe. Fluid extends through the craniotomy defect into the subgaleal tissues where there is a  fluid collection measuring approximately 6.2 x 3.0 cm. 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 Significant progression in leptomeningeal enhancement diffusely and bilaterally. This also extends into the posterior fossa. Ventricle size normal.  No acute infarct.  No midline shift. Vascular: Normal arterial flow voids Skull and upper cervical spine: Right temporal craniotomy with pseudomeningocele as described above. Sinuses/Orbits: Negative Other: None IMPRESSION: Postsurgical resection of large right temporal lobe mass. Surgical fluid collection in the right temporal lobe extends through the craniotomy defect into the subgaleal tissues compatible with pseudomeningocele. Irregular enhancement of the leptomeninges has progressed significantly since 06/08/2019 compatible with  leptomeningeal carcinomatosis. Electronically Signed   By: Franchot Gallo M.D.   On: 08/18/2019 17:45   Dg Chest Port 1 View  Result Date: 08/18/2019 CLINICAL DATA:  Somnolence. EXAM: PORTABLE CHEST 1 VIEW COMPARISON:  Radiograph 08/16/2019. PET CT 07/20/2019 FINDINGS: The cardiomediastinal contours are unchanged, upper normal heart size. No pulmonary edema. Slight increase in bibasilar atelectasis from prior. No consolidation, pleural effusion, or pneumothorax. No acute osseous abnormalities are seen. IMPRESSION: Slight increase in bibasilar atelectasis from prior. Otherwise no acute findings. Electronically Signed   By: Keith Rake M.D.   On: 08/18/2019 22:47   ASSESSMENT AND PLAN:  53 year old female with a known history of metastatic melanoma of brain is being admitted for acute metabolic encephalopathy  *Acute metabolic encephalopathy -present on admission, now improving -Likely multifactorial -Urine tox positive for cannabinoids -Cannot rule out CNS infections like infected collection vs encephalitis -ID following and started Vanco, Meropenem and acyclovir patient has been running fever for last few days -Her MRI yesterday showed progression of leptomeningeal carcinomatosis  *Nausea and vomiting -Provide symptomatic management  *Hypertension -Continue home blood pressure medicine  * 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  -Followed by Dr. Grayland Ormond -getting chemo, XRT and may be immunotherapy -Next follow-up appointment at cancer clinic on Thursday, October 8 initiate cycle 1 of combination immunotherapy.  -Brain biopsy: Patient has appointment at [facility] on August 23, 2019 for continued postsurgical follow-up. - CT head shows Right temporal lobe mass resection with large pseudomeningocele overlying the cranioplasty  plate  *Possible encephalitis/infected fluid - 1/4 blood culture bottles grew staph species, methicillin resistant -likely false-positive, skin contaminant -Considering immunocompromise state and low-grade fever (T-max 101.7) -infectious disease is considering infected collection vs encephalitis - started Vanco, Meropenem and acyclovir on 10/2    Overall very poor prognosis.  I will let lumbar puncture decision made by oncology/neuro/ID   All the records are reviewed and case discussed with Care Management/Social Worker. Management plans discussed with the patient, family (discussed with husband Symone Collado at bedside and they are in agreement.  CODE STATUS: Full Code  TOTAL TIME TAKING CARE OF THIS PATIENT: 35 minutes.   More than 50% of the time was spent in counseling/coordination of care: YES  POSSIBLE D/C IN 1-2 DAYS, DEPENDING ON CLINICAL CONDITION.   Max Sane M.D on 08/19/2019 at 11:32 AM  Between 7am to 6pm - Pager - [phone]  After 6pm go to [email or web address] - Proofreader  [facility]  Office  [phone]  CC: Primary care physician; Pleas Koch, NP  Note: This dictation was prepared with Dragon dictation along with smaller phrase technology. Any transcriptional errors that result from this process are unintentional.

## 2019-08-19 NOTE — Consult Note (Addendum)
Pharmacy Antibiotic Note  Vickie Waters is a 53 y.o. 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  Pharmacy has been consulted for Meropenem, Vancomycin, and Acyclovir dosing.  Allergy: PCN, Cephalosporins (2016)   Per chart review, patient does not have a history of recent antibiotic use. Per spouse and nurse, patient is a poor historian at this time. Allergy history was discussed with the spouse who reported that the patient has had Amoxicillin with no allergic reactions. He also stated that he was unaware that the patient even had a PCN or cephalosporin allergy. However, the spouse was unable to verify if the patient has had cephalosporins. I attempted to call the patient's mom with number provided but was unable to get in contact with her. Will try again later today. MD notified of allergy and discussion with patient's spouse- was told to continue meropenem for now.    Update @ 1154: During rounds, it was noted that meropenem was not given and the nurse was asked to hold off on administering the antibiotic until after discussing the allergic reaction with mom and/or patient. Talked to the patient and the mother. The patient believes that she has had hives that covered her total body but could not recall whether she was hospitalized- when prompted by mother it was noted that she was seen by here PCP for the reaction from Amoxicillin. However, the mother denies it was anaphylactic in nature. Additionally, the mother nor the patient were able to recall if the patient has ever been on any cephalosporins.   BMI 32.7  Scr 0.7 mg/dL (will need to round up for Vancomycin dosing)  Plan: 1. Will order Acyclovir 10 mg/kg Q8H (used adjusted BW d/t BMI > 30).  2. Meropenem 2 g Q8H. Update: This medication was initially due at 0900, but will adjust the starting time accordingly. Please notify MD or pharmacy if patient has a reaction while on this medication.   3. Vancomycin:   Loading dose: 2000 mg x1 dose  Maintenance dose: 1250 mg Q24H AUC goal: 400-550 Expected AUC: 461.6   Height: 5\' 4"  (162.6 cm) Weight: 190 lb 7.6 oz (86.4 kg) IBW/kg (Calculated) : 54.7  Temp (24hrs), Avg:99.8 F (37.7 C), Min:98.6 F (37 C), Max:101.7 F (38.7 C)  Recent Labs  Lab 08/16/19 0748 08/17/19 0512 08/18/19 0344 08/19/19 0446  WBC 16.4* 10.1 12.8* 12.5*  CREATININE 0.94 0.66 0.82 0.71    Estimated Creatinine Clearance: 86.5 mL/min (by C-G formula based on SCr of 0.71 mg/dL).    Allergies  Allergen Reactions  . Cephalosporins Hives  . Penicillins Hives    Antimicrobials this admission: 10/2 Vancomycin >>  10/2 Meropenem >>  10/2 Acyclovir  >>   Microbiology results: 9/29 BCx: Coag neg staph 1 of 4 bottle; MRSA detected--  likely a skin contaminant   Thank you for allowing pharmacy to be a part of this patient's care.  Rowland Lathe 08/19/2019 8:03 AM

## 2019-08-19 NOTE — Consult Note (Signed)
Reason for Consult: confusion  Referring Physician: Dr. Manuella Ghazi   CC: confusion   HPI: Vickie Waters is an 53 y.o. female   history of HTn, diabetes, with metastatic melanoma is admitted with confusion/nausea and vomiting.  Pt is s/p craniotomy and resection on  06/30/19 at [facility] for metastatic melanoma. The pathology was malignant melanoma after which she underwent radiation.  She is now admitted with confusion, nausea and vomiting.  The fatigue has been progressive as per family.    Past Medical History:  Diagnosis Date  . Allergy   . Asthma   . Cancer ([place])   . Depression   . Frequent headaches   . Hypertension   . Migraine     Past Surgical History:  Procedure Laterality Date  . CHOLECYSTECTOMY  2001    Family History  Problem Relation Age of Onset  . Arthritis Mother   . Hypertension Mother   . Hypertension Father   . Diabetes Father   . Mental illness Maternal Grandfather   . Breast cancer Neg Hx     Social History:  reports that she has been smoking cigarettes. She has been smoking about 1.00 pack per day. She has never used smokeless tobacco. She reports current alcohol use. She reports that she does not use drugs.  Allergies  Allergen Reactions  . Cephalosporins Hives  . Penicillins Hives    Medications: I have reviewed the patient's current medications.  ROS: Unable to obtain due to confusion   Physical Examination: Blood pressure (!) 144/94, pulse (!) 56, temperature 99.3 F (37.4 C), temperature source Axillary, resp. rate 15, height 5\' 4"  (1.626 m), weight 86.4 kg, SpO2 98 %.   Neurological Examination   Mental Status: Alert to name only. Couldn't tell me date/month or location.  Cranial Nerves: II: Discs flat bilaterally; Visual fields grossly normal, pupils equal, round, reactive to light and accommodation III,IV, VI: ptosis not present, extra-ocular motions intact bilaterally V,VII: smile symmetric, facial light touch sensation normal  bilaterally VIII: hearing normal bilaterally IX,X: gag reflex present XI: bilateral shoulder shrug XII: midline tongue extension Motor: Generalized weakness bilaterally.  Tone and bulk:normal tone throughout; no atrophy noted Sensory: Pinprick and light touch intact throughout, bilaterally Deep Tendon Reflexes: 1+ and symmetric throughout Plantars: Right: downgoing   Left: downgoing Cerebellar: normal finger-to-nose   Laboratory Studies:   Basic Metabolic Panel: Recent Labs  Lab 08/16/19 0748 08/17/19 0512 08/18/19 0344 08/19/19 0446  NA 140 140 138 138  K 3.8 4.3 4.0 4.1  CL 99 104 103 106  CO2 30 26 27 24   GLUCOSE 120* 122* 96 139*  BUN 29* 21* 18 14  CREATININE 0.94 0.66 0.82 0.71  CALCIUM 9.1 9.0 8.9 9.0    Liver Function Tests: Recent Labs  Lab 08/16/19 0748  AST 24  ALT 39  ALKPHOS 65  BILITOT 1.3*  PROT 6.9  ALBUMIN 4.1   Recent Labs  Lab 08/16/19 0748  LIPASE 30   Recent Labs  Lab 08/16/19 1005  AMMONIA 10    CBC: Recent Labs  Lab 08/16/19 0748 08/17/19 0512 08/18/19 0344 08/19/19 0446  WBC 16.4* 10.1 12.8* 12.5*  NEUTROABS 13.7*  --   --   --   HGB 15.6* 14.4 13.7 14.5  HCT 47.2* 43.1 41.3 43.9  MCV 90.9 91.9 90.4 90.5  PLT 193 180 168 148*    Cardiac Enzymes: No results for input(s): CKTOTAL, CKMB, CKMBINDEX, TROPONINI in the last 168 hours.  BNP: Invalid input(s): POCBNP  CBG: Recent Labs  Lab 08/17/19 0742 08/18/19 0801 08/18/19 2155 08/19/19 0751  GLUCAP 96 98 104* 126*    Microbiology: Results for orders placed or performed during the hospital encounter of 08/16/19  SARS CORONAVIRUS 2 (TAT 6-24 HRS) Nasopharyngeal Nasopharyngeal Swab     Status: None   Collection Time: 08/16/19  7:48 AM   Specimen: Nasopharyngeal Swab  Result Value Ref Range Status   SARS Coronavirus 2 NEGATIVE NEGATIVE Final    Comment: (NOTE) SARS-CoV-2 target nucleic acids are NOT DETECTED. The SARS-CoV-2 RNA is generally detectable in  upper and lower respiratory specimens during the acute phase of infection. Negative results do not preclude SARS-CoV-2 infection, do not rule out co-infections with other pathogens, and should not be used as the sole basis for treatment or other patient management decisions. Negative results must be combined with clinical observations, patient history, and epidemiological information. The expected result is Negative. Fact Sheet for Patients: SugarRoll.be Fact Sheet for Healthcare Providers: https://www.woods-mathews.com/ This test is not yet approved or cleared by the [place] FDA and  has been authorized for detection and/or diagnosis of SARS-CoV-2 by FDA under an Emergency Use Authorization (EUA). This EUA will remain  in effect (meaning this test can be used) for the duration of the COVID-19 declaration under Section 56 4(b)(1) of the Act, 21 U.S.C. section 360bbb-3(b)(1), unless the authorization is terminated or revoked sooner. Performed at [facility], [place] [address] [place], [place] [address]   Blood Culture (routine x 2)     Status: Abnormal   Collection Time: 08/16/19  7:48 AM   Specimen: BLOOD  Result Value Ref Range Status   Specimen Description   Final    BLOOD LEFT ANTECUBITAL Performed at [facility], [address] [place], [place] [address]    Special Requests   Final    BOTTLES DRAWN AEROBIC AND ANAEROBIC Blood Culture results may not be optimal due to an excessive volume of blood received in culture bottles Performed at [facility], [address], [place], [place] [address]    Culture  Setup Time   Final    GRAM POSITIVE COCCI AEROBIC BOTTLE ONLY CRITICAL RESULT CALLED TO, READ BACK BY AND VERIFIED WITH: SCOTT HALL AT [place] ON 08/17/2019 [place].    Culture (A)  Final    STAPHYLOCOCCUS SPECIES (COAGULASE NEGATIVE) THE SIGNIFICANCE OF ISOLATING THIS ORGANISM FROM A SINGLE SET OF BLOOD  CULTURES WHEN MULTIPLE SETS ARE DRAWN IS UNCERTAIN. PLEASE NOTIFY THE MICROBIOLOGY DEPARTMENT WITHIN ONE WEEK IF SPECIATION AND SENSITIVITIES ARE REQUIRED. Performed at [facility], [place] [address] [place],  [address]    Report Status 08/18/2019 FINAL  Final  Blood Culture ID Panel (Reflexed)     Status: Abnormal   Collection Time: 08/16/19  7:48 AM  Result Value Ref Range Status   Enterococcus species NOT DETECTED NOT DETECTED Final   Listeria monocytogenes NOT DETECTED NOT DETECTED Final   Staphylococcus species DETECTED (A) NOT DETECTED Final    Comment: Methicillin (oxacillin) resistant coagulase negative staphylococcus. Possible blood culture contaminant (unless isolated from more than one blood culture draw or clinical case suggests pathogenicity). No antibiotic treatment is indicated for blood  culture contaminants. CRITICAL RESULT CALLED TO, READ BACK BY AND VERIFIED WITH: SCOTT HALL AT 0701 ON 08/17/2019 [place].    Staphylococcus aureus (BCID) NOT DETECTED NOT DETECTED Final   Methicillin resistance DETECTED (A) NOT DETECTED Final    Comment: CRITICAL RESULT CALLED TO, READ BACK  BY AND VERIFIED WITH: SCOTT HALL AT [place] ON 08/17/2019 [place].    Streptococcus species NOT DETECTED NOT DETECTED Final   Streptococcus agalactiae NOT DETECTED NOT DETECTED Final   Streptococcus pneumoniae NOT DETECTED NOT DETECTED Final   Streptococcus pyogenes NOT DETECTED NOT DETECTED Final   Acinetobacter baumannii NOT DETECTED NOT DETECTED Final   Enterobacteriaceae species NOT DETECTED NOT DETECTED Final   Enterobacter cloacae complex NOT DETECTED NOT DETECTED Final   Escherichia coli NOT DETECTED NOT DETECTED Final   Klebsiella oxytoca NOT DETECTED NOT DETECTED Final   Klebsiella pneumoniae NOT DETECTED NOT DETECTED Final   Proteus species NOT DETECTED NOT DETECTED Final   Serratia marcescens NOT DETECTED NOT DETECTED Final   Haemophilus influenzae NOT DETECTED NOT DETECTED Final    Neisseria meningitidis NOT DETECTED NOT DETECTED Final   Pseudomonas aeruginosa NOT DETECTED NOT DETECTED Final   Candida albicans NOT DETECTED NOT DETECTED Final   Candida glabrata NOT DETECTED NOT DETECTED Final   Candida krusei NOT DETECTED NOT DETECTED Final   Candida parapsilosis NOT DETECTED NOT DETECTED Final   Candida tropicalis NOT DETECTED NOT DETECTED Final    Comment: Performed at [facility], [address] [place], [place] [address]  Blood Culture (routine x 2)     Status: None (Preliminary result)   Collection Time: 08/16/19  7:49 AM   Specimen: BLOOD  Result Value Ref Range Status   Specimen Description BLOOD LEFT HAND  Final   Special Requests   Final    BOTTLES DRAWN AEROBIC AND ANAEROBIC Blood Culture adequate volume   Culture   Final    NO GROWTH 3 DAYS Performed at [facility], [address] [place], [place] [address]    Report Status PENDING  Incomplete    Coagulation Studies: No results for input(s): LABPROT, INR in the last 72 hours.  Urinalysis:  Recent Labs  Lab 08/16/19 0917 08/19/19 0009  COLORURINE YELLOW* STRAW*  LABSPEC 1.023 1.010  PHURINE 5.0 7.0  GLUCOSEU NEGATIVE NEGATIVE  HGBUR NEGATIVE NEGATIVE  BILIRUBINUR NEGATIVE NEGATIVE  KETONESUR NEGATIVE NEGATIVE  PROTEINUR NEGATIVE NEGATIVE  NITRITE NEGATIVE NEGATIVE  LEUKOCYTESUR NEGATIVE NEGATIVE    Lipid Panel:     Component Value Date/Time   CHOL 185 10/19/2018 1001   TRIG 99.0 10/19/2018 1001   HDL 43.00 10/19/2018 1001   CHOLHDL 4 10/19/2018 1001   VLDL 19.8 10/19/2018 1001   LDLCALC 122 (H) 10/19/2018 1001    HgbA1C:  Lab Results  Component Value Date   HGBA1C 6.1 10/19/2018    Urine Drug Screen:      Component Value Date/Time   LABOPIA NONE DETECTED 08/16/2019 0919   COCAINSCRNUR NONE DETECTED 08/16/2019 0919   LABBENZ NONE DETECTED 08/16/2019 0919   AMPHETMU NONE DETECTED 08/16/2019 0919   THCU POSITIVE (A) 08/16/2019 0919   LABBARB NONE  DETECTED 08/16/2019 0919    Alcohol Level: No results for input(s): ETH in the last 168 hours.  Other results: EKG: normal EKG, normal sinus rhythm, unchanged from previous tracings.  Imaging: [name] [id number] Contrast  Result Date: 08/18/2019 CLINICAL DATA:  Metastatic melanoma. Postop craniotomy. Altered mental status. EXAM: MRI HEAD WITHOUT AND WITH CONTRAST TECHNIQUE: Multiplanar, multiecho pulse sequences of the brain and surrounding structures were obtained without and with intravenous contrast. CONTRAST:  71mL GADAVIST GADOBUTROL 1 MMOL/ML IV SOLN COMPARISON:  CT head 08/16/2019.  MRI head with contrast 06/08/2019 FINDINGS: Brain: Resection of tumor mass in the right temporal lobe since the prior  MRI. Surgical encephalomalacia in the right inferior and anterior temporal lobe with irregular enhancement of the post surgical cavity in the right temporal lobe. Fluid extends through the craniotomy defect into the subgaleal tissues where there is a fluid collection measuring approximately 6.2 x 3.0 cm. 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 Significant progression in leptomeningeal enhancement diffusely and bilaterally. This also extends into the posterior fossa. Ventricle size normal.  No acute infarct.  No midline shift. Vascular: Normal arterial flow voids Skull and upper cervical spine: Right temporal craniotomy with pseudomeningocele as described above. Sinuses/Orbits: Negative Other: None IMPRESSION: Postsurgical resection of large right temporal lobe mass. Surgical fluid collection in the right temporal lobe extends through the craniotomy defect into the subgaleal tissues compatible with pseudomeningocele. Irregular enhancement of the leptomeninges has progressed significantly since 06/08/2019 compatible with leptomeningeal carcinomatosis. Electronically Signed   By: Franchot Gallo M.D.   On: 08/18/2019 17:45   Dg Chest Port 1 View  Result Date: 08/18/2019 CLINICAL  DATA:  Somnolence. EXAM: PORTABLE CHEST 1 VIEW COMPARISON:  Radiograph 08/16/2019. PET CT 07/20/2019 FINDINGS: The cardiomediastinal contours are unchanged, upper normal heart size. No pulmonary edema. Slight increase in bibasilar atelectasis from prior. No consolidation, pleural effusion, or pneumothorax. No acute osseous abnormalities are seen. IMPRESSION: Slight increase in bibasilar atelectasis from prior. Otherwise no acute findings. Electronically Signed   By: Keith Rake M.D.   On: 08/18/2019 22:47     Assessment/Plan:  53 y.o. female   history of HTn, diabetes, with metastatic melanoma is admitted with confusion/nausea and vomiting.  Pt is s/p craniotomy and resection on  06/30/19 at [facility] for metastatic melanoma. The pathology was malignant melanoma after which she underwent radiation.  She is now admitted with confusion, nausea and vomiting.  The fatigue has been progressive as per family.    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 Currently 99.3 - I am not convinced this is cental fever - if EEG no temporal spikes likely no need of acyclovir - tried to call husband about leptomeningeal enhancement today.    08/19/2019, 10:54 AM

## 2019-08-19 NOTE — Progress Notes (Signed)
Pt has fever so will cover for CNS infections like infected collection vs encephalitis  VAnco Meropenem and acyclovir

## 2019-08-19 NOTE — Progress Notes (Signed)
Hematology/Oncology Progress Note [facility] [phone] [phone]  Patient Care Team: Pleas Koch, NP as PCP - General (Nurse Practitioner)   Name of the patient: Vickie Waters  [id number]  09/02/1966  Date of visit: 08/19/19   INTERVAL HISTORY-  Mental status improved.  Answer questions appropriately.  Mother at the bedside. Denies any headache, vision changes, denies nausea.    Review of systems- Review of Systems  Constitutional: Positive for fatigue. Negative for appetite change.  Respiratory: Negative for cough and shortness of breath.   Cardiovascular: Negative for chest pain and leg swelling.  Gastrointestinal: Negative for abdominal distention and abdominal pain.  Genitourinary: Negative for dysuria.   Musculoskeletal: Negative for arthralgias.  Neurological: Negative for headaches.  Hematological: Negative for adenopathy.  Psychiatric/Behavioral: The patient is not nervous/anxious.     Allergies  Allergen Reactions  . Cephalosporins Hives  . Penicillins Hives    Patient Active Problem List   Diagnosis Date Noted  . Palliative care encounter   . Disorientation   . Metastatic melanoma ([place])   . Acute metabolic encephalopathy [id number]  . 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 Goals of care, counseling/discussion 07/15/2019  . Altered taste 05/09/2019  . Murmur, cardiac 03/04/2016  . Preventative health care 03/04/2016  . Type 2 diabetes mellitus ([place]) 03/04/2016  . Vitamin D deficiency 03/04/2016  . Migraine with aura and with status migrainosus, not intractable 11/15/2015  . Essential hypertension 08/23/2015  . Anxiety and depression 07/12/2015  . Tobacco abuse 07/12/2015     Past Medical History:  Diagnosis Date  . Allergy   . Asthma   . Cancer ([place])   . Depression   . Frequent headaches   . Hypertension   . Migraine      Past Surgical History:  Procedure Laterality  Date  . CHOLECYSTECTOMY  2001    Social History   Socioeconomic History  . Marital status: Married    Spouse name: Not on file  . Number of children: Not on file  . Years of education: Not on file  . Highest education level: Not on file  Occupational History  . Not on file  Social Needs  . Financial resource strain: Not hard at all  . Food insecurity    Worry: Never true    Inability: Never true  . Transportation needs    Medical: No    Non-medical: No  Tobacco Use  . Smoking status: Current Every Day Smoker    Packs/day: 1.00    Types: Cigarettes  . Smokeless tobacco: Never Used  Substance and Sexual Activity  . Alcohol use: Yes    Alcohol/week: 0.0 standard drinks    Comment: social  . Drug use: Never  . Sexual activity: Not on file  Lifestyle  . Physical activity    Days per week: Patient refused    Minutes per session: Patient refused  . Stress: Not on file  Relationships  . Social Herbalist on phone: Patient refused    Gets together: Patient refused    Attends religious service: Patient refused    Active member of club or organization: Patient refused    Attends meetings of clubs or organizations: Patient refused    Relationship status: Patient refused  . Intimate partner violence    Fear of current or ex partner: No    Emotionally abused: No    Physically abused: No    Forced sexual activity: No  Other Topics Concern  . Not on file  Social History Narrative   Married.   2 children   Works as an Scientist, water quality.   Enjoys spending time with her family.     Family History  Problem Relation Age of Onset  . Arthritis Mother   . Hypertension Mother   . Hypertension Father   . Diabetes Father   . Mental illness Maternal Grandfather   . Breast cancer Neg Hx      Current Facility-Administered Medications:  .  0.9 %  sodium chloride infusion, , Intravenous, Continuous, Manuella Ghazi, Vipul, MD, Last Rate: 100 mL/hr at 08/19/19 0100 .   acetaminophen (TYLENOL) tablet 650 mg, 650 mg, Oral, Q6H PRN, 650 mg at 08/18/19 0527 **OR** acetaminophen (TYLENOL) suppository 650 mg, 650 mg, Rectal, Q6H PRN, Max Sane, MD, 650 mg at 08/18/19 2159 .  acyclovir (ZOVIRAX) 675 mg in dextrose 5 % 100 mL IVPB, 10 mg/kg (Adjusted), Intravenous, Q8H, Duncan, Asajah R, RPH .  amLODipine (NORVASC) tablet 5 mg, 5 mg, Oral, Daily, Manuella Ghazi, Vipul, MD, 5 mg at 08/19/19 1253 .  bisacodyl (DULCOLAX) EC tablet 5 mg, 5 mg, Oral, Daily PRN, Manuella Ghazi, Vipul, MD .  dexamethasone (DECADRON) injection 4 mg, 4 mg, Intravenous, Q8H, Manuella Ghazi, Vipul, MD, 4 mg at 08/19/19 0459 .  docusate sodium (COLACE) capsule 100 mg, 100 mg, Oral, BID, Max Sane, MD, 100 mg at 08/19/19 1253 .  enoxaparin (LOVENOX) injection 40 mg, 40 mg, Subcutaneous, Q24H, Max Sane, MD, 40 mg at 08/18/19 2218 .  feeding supplement (ENSURE ENLIVE) (ENSURE ENLIVE) liquid 237 mL, 237 mL, Oral, TID BM, Manuella Ghazi, Vipul, MD .  HYDROcodone-acetaminophen (NORCO/VICODIN) 5-325 MG per tablet 1-2 tablet, 1-2 tablet, Oral, Q4H PRN, Max Sane, MD, 1 tablet at 08/17/19 1659 .  influenza vac split quadrivalent PF (FLUARIX) injection 0.5 mL, 0.5 mL, Intramuscular, Tomorrow-1000, Manuella Ghazi, Vipul, MD .  levETIRAcetam (KEPPRA) IVPB 1000 mg/100 mL premix, 1,000 mg, Intravenous, BID, Max Sane, MD, Stopped at 08/18/19 2316 .  meropenem (MERREM) 2 g in sodium chloride 0.9 % 100 mL IVPB, 2 g, Intravenous, Q8H, Rowland Lathe, RPH .  metoprolol succinate (TOPROL-XL) 24 hr tablet 25 mg, 25 mg, Oral, Daily, Manuella Ghazi, Vipul, MD, 25 mg at 08/19/19 1253 .  multivitamin with minerals tablet 1 tablet, 1 tablet, Oral, Daily, Max Sane, MD, 1 tablet at 08/19/19 1254 .  ondansetron (ZOFRAN) tablet 4 mg, 4 mg, Oral, Q6H PRN **OR** ondansetron (ZOFRAN) injection 4 mg, 4 mg, Intravenous, Q6H PRN, Manuella Ghazi, Vipul, MD .  ondansetron (ZOFRAN) tablet 8 mg, 8 mg, Oral, BID, Manuella Ghazi, Vipul, MD, 8 mg at 08/19/19 1253 .  pantoprazole (PROTONIX) EC tablet 40  mg, 40 mg, Oral, Daily, Manuella Ghazi, Vipul, MD, 40 mg at 08/19/19 1253 .  PARoxetine (PAXIL) tablet 20 mg, 20 mg, Oral, Daily, Manuella Ghazi, Vipul, MD, 20 mg at 08/18/19 1138 .  pneumococcal 23 valent vaccine (PNU-IMMUNE) injection 0.5 mL, 0.5 mL, Intramuscular, Tomorrow-1000, Manuella Ghazi, Vipul, MD .  traZODone (DESYREL) tablet 25 mg, 25 mg, Oral, QHS PRN, Max Sane, MD .  Derrill Memo ON 08/20/2019] vancomycin (VANCOCIN) 1,250 mg in sodium chloride 0.9 % 250 mL IVPB, 1,250 mg, Intravenous, Q24H, Rowland Lathe, [facility]   Physical exam:  Vitals:   08/19/19 0100 08/19/19 0130 08/19/19 0400 08/19/19 0500  BP: 118/84 127/88 (!) 144/94   Pulse: (!) 58 (!) 57 (!) 56   Resp:      Temp:      TempSrc:      SpO2:  Weight:    190 lb 7.6 oz (86.4 kg)  Height:       Physical Exam  Constitutional: She is oriented to person, place, and time. No distress.  HENT:  Head: Atraumatic.  Right temple soft tissue swelling  Eyes: Pupils are equal, round, and reactive to light. EOM are normal. No scleral icterus.  Neck: Normal range of motion.  Pulmonary/Chest: Effort normal and breath sounds normal.  Abdominal: Soft. Bowel sounds are normal. She exhibits no distension.  Musculoskeletal: Normal range of motion.        General: No edema.  Neurological: She is alert and oriented to person, place, and time.  Today she follows commands, orientated x2-3, answers questions appropriately  Skin: Skin is warm and dry.  Psychiatric: Affect normal.       CMP Latest Ref Rng & Units 08/19/2019  Glucose 70 - 99 mg/dL 139(H)  BUN 6 - 20 mg/dL 14  Creatinine 0.44 - 1.00 mg/dL 0.71  Sodium 135 - 145 mmol/L 138  Potassium 3.5 - 5.1 mmol/L 4.1  Chloride 98 - 111 mmol/L 106  CO2 22 - 32 mmol/L 24  Calcium 8.9 - 10.3 mg/dL 9.0  Total Protein 6.5 - 8.1 g/dL -  Total Bilirubin 0.3 - 1.2 mg/dL -  Alkaline Phos 38 - 126 U/L -  AST 15 - 41 U/L -  ALT 0 - 44 U/L -   CBC Latest Ref Rng & Units 08/19/2019  WBC 4.0 - 10.5 K/uL 12.5(H)   Hemoglobin 12.0 - 15.0 g/dL 14.5  Hematocrit 36.0 - 46.0 % 43.9  Platelets 150 - 400 K/uL 148(L)    RADIOGRAPHIC STUDIES: I have personally reviewed the radiological images as listed and agreed with the findings in the report.   Ct Head Wo Contrast  Result Date: 08/16/2019 CLINICAL DATA:  Altered mental status EXAM: CT HEAD WITHOUT CONTRAST TECHNIQUE: Contiguous axial images were obtained from the base of the skull through the vertex without intravenous contrast. COMPARISON:  06/08/2019 FINDINGS: Brain: Postoperative right temporal lobe. Superficial to the craniectomy flap is a lobulated tense appearing fluid collection in the scalp and upper masticator space, measuring up to 6 x 3.6 cm on axial slices. No acute hemorrhage, hydrocephalus, or intracranial mass effect. Vascular: No hyperdense vessel or unexpected calcification. Skull: Right pterional craniectomy with cranioplasty. Sinuses/Orbits: Negative IMPRESSION: 1. No acute intracranial finding. 2. 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 Electronically Signed   By: Monte Fantasia M.D.   On: 08/16/2019 08:57   [name] [id number] Contrast  Result Date: 08/18/2019 CLINICAL DATA:  Metastatic melanoma. Postop craniotomy. Altered mental status. EXAM: MRI HEAD WITHOUT AND WITH CONTRAST TECHNIQUE: Multiplanar, multiecho pulse sequences of the brain and surrounding structures were obtained without and with intravenous contrast. CONTRAST:  61mL GADAVIST GADOBUTROL 1 MMOL/ML IV SOLN COMPARISON:  CT head 08/16/2019.  MRI head with contrast 06/08/2019 FINDINGS: Brain: Resection of tumor mass in the right temporal lobe since the prior MRI. Surgical encephalomalacia in the right inferior and anterior temporal lobe with irregular enhancement of the post surgical cavity in the right temporal lobe. Fluid extends through the craniotomy defect into the subgaleal tissues where there is a fluid collection measuring  approximately 6.2 x 3.0 cm. 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 Significant progression in leptomeningeal enhancement diffusely and bilaterally. This also extends into the posterior fossa. Ventricle size normal.  No acute infarct.  No midline shift. Vascular: Normal arterial flow  voids Skull and upper cervical spine: Right temporal craniotomy with pseudomeningocele as described above. Sinuses/Orbits: Negative Other: None IMPRESSION: Postsurgical resection of large right temporal lobe mass. Surgical fluid collection in the right temporal lobe extends through the craniotomy defect into the subgaleal tissues compatible with pseudomeningocele. Irregular enhancement of the leptomeninges has progressed significantly since 06/08/2019 compatible with leptomeningeal carcinomatosis. Electronically Signed   By: Franchot Gallo M.D.   On: 08/18/2019 17:45   Dg Chest Port 1 View  Result Date: 08/18/2019 CLINICAL DATA:  Somnolence. EXAM: PORTABLE CHEST 1 VIEW COMPARISON:  Radiograph 08/16/2019. PET CT 07/20/2019 FINDINGS: The cardiomediastinal contours are unchanged, upper normal heart size. No pulmonary edema. Slight increase in bibasilar atelectasis from prior. No consolidation, pleural effusion, or pneumothorax. No acute osseous abnormalities are seen. IMPRESSION: Slight increase in bibasilar atelectasis from prior. Otherwise no acute findings. Electronically Signed   By: Keith Rake M.D.   On: 08/18/2019 22:47   Dg Chest Portable 1 View  Result Date: 08/16/2019 CLINICAL DATA:  Altered mental status EXAM: PORTABLE CHEST 1 VIEW COMPARISON:  PET-CT July 20, 2019 FINDINGS: Lungs are clear. Heart is upper normal in size with pulmonary vascularity normal. No adenopathy. No bone lesions. IMPRESSION: No edema or consolidation. No evident adenopathy. Heart upper normal in size. Electronically Signed   By: Lowella Grip III M.D.   On: 08/16/2019 08:56    Assessment and plan-   Patient is a 53 y.o. female with history of stage IV melanoma with brain involvement currently under going radiation presented with acute change of mental status, nausea vomiting.  #Stage IV melanoma with brain involvement, status post open brain biopsy currently on radiation. #Acute change of mental status, intermittent, today mental status has improved. MRI brain has been obtained which showed concern irregular enhancement of leptomeningeal, compatible with leptomeningeal carcinomatosis. I discussed with radiology Dr. Carlis Abbott and confirmed that patient has leptomeningeal involvement on 06/08/2019 initial work-up MRI brain, this is compatible with progression of leptomeningeal carcinomatosis.  Since patient also has had history of open brain biopsy, infectious etiology cannot be completely ruled out.  I.e. fungal infections. I had a discussion with Dr. Manuella Ghazi, ID Dr. Tama High, neurology Dr.Zeylikman.  Ideally lumbar puncture will help to determining intracranial pressure, rule out infectious etiology and confirm leptomeningeal carcinomatosis.  Patient had low-grade fever on admission and also had symptoms of nausea and vomiting.  Per neurology, there might be increased risk of herniation.  Neurology plans to do EEG.  Lumbar puncture plan was held due to the risk. Antibiotics management per ID.  today her mental status has improved, though her mother at the bedside, not completely to her baseline yet. I have increased the Decadron to 4 mg 3 times daily yesterday.  Continue. Dr. Grayland Ormond plans to start immunotherapy nivolumab with ipilimumab next week.  Immunotherapy has a chance to penetrate to CNS and hopefully she will respond to the treatments.  Continue monitor her mental status for now. Palliative care service following. Per patient and her mother's request, I called patient's husband Mr. Schlitt and had a discussion and updated him.  All questions answered.   Thank you for allowing me to  participate in the care of this patient.   Earlie Server, MD, PhD Hematology Oncology [facility] Pager- [phone] 08/19/2019

## 2019-08-19 NOTE — Consult Note (Signed)
[place]  [phone] [phone]   Name: Vickie Waters Date: 08/19/2019 MRN: [id number]  DOB: [birth year]  Patient Care Team: Pleas Koch, NP as PCP - General (Nurse Practitioner)    REASON FOR CONSULTATION: Palliative Care consult requested for this 53 y.o. female with multiple medical problems including metastatic melanoma of the brain s/p open brain biopsy at [facility] on 06/30/19. She was started on XRT with plan to start on immunotherapy. She was admitted to hospital 08/16/2019 with altered mental status and nausea and vomiting.  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  Palliative care was consulted to help address goals.  SOCIAL HISTORY:     reports that she has been smoking cigarettes. She has been smoking about 1.00 pack per day. She has never used smokeless tobacco. She reports current alcohol use. She reports that she does not use drugs.   Patient is married and lives at home with her husband, 33 year old son and 14 year old son.  Patient's parents are both living and involved in her care.  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  ADVANCE DIRECTIVES:  Does not have  CODE STATUS: Full code  PAST MEDICAL HISTORY: Past Medical History:  Diagnosis Date  . Allergy   . Asthma   . Cancer ([place])   . Depression   . Frequent headaches   . Hypertension   . Migraine     PAST SURGICAL HISTORY:  Past Surgical History:  Procedure Laterality Date  . CHOLECYSTECTOMY  2001    HEMATOLOGY/ONCOLOGY HISTORY:  Oncology History  Malignant melanoma metastatic to brain ([place])  07/15/2019 Initial Diagnosis   Malignant melanoma metastatic to brain [facility])   07/15/2019 Cancer Staging   Staging form: Melanoma of the Skin, AJCC 8th Edition - Clinical stage from 07/15/2019: Stage IV (cTX, cNX, cM1d(0)) - Signed by Lloyd Huger, MD on  07/15/2019   08/25/2019 -  Chemotherapy   The patient had ipilimumab (YERVOY) 250 mg in sodium chloride 0.9 % 100 mL chemo infusion, 3 mg/kg = 250 mg, Intravenous,  Once, 0 of 4 cycles nivolumab (OPDIVO) 84 mg in sodium chloride 0.9 % 50 mL chemo infusion, 1 mg/kg = 84 mg, Intravenous, Once, 0 of 10 cycles  for chemotherapy treatment.      ALLERGIES:  is allergic to cephalosporins and penicillins.  MEDICATIONS:  Current Facility-Administered Medications  Medication Dose Route Frequency Provider Last Rate Last Dose  . 0.9 %  sodium chloride infusion   Intravenous Continuous Max Sane, MD 100 mL/hr at 08/19/19 0100    . acetaminophen (TYLENOL) tablet 650 mg  650 mg Oral Q6H PRN Max Sane, MD   650 mg at 08/18/19 0527   Or  . acetaminophen (TYLENOL) suppository 650 mg  650 mg Rectal Q6H PRN Max Sane, MD   650 mg at 08/18/19 2159  . acyclovir (ZOVIRAX) 675 mg in dextrose 5 % 100 mL IVPB  10 mg/kg (Adjusted) Intravenous Q8H Duncan, Asajah R, RPH      . amLODipine (NORVASC) tablet 5 mg  5 mg Oral Daily Max Sane, MD   5 mg at 08/18/19 1137  . bisacodyl (DULCOLAX) EC tablet 5 mg  5 mg Oral Daily PRN Max Sane, MD      . dexamethasone (DECADRON) injection 4 mg  4 mg Intravenous Q8H Max Sane, MD   4 mg at 08/19/19 0459  . docusate sodium (COLACE) capsule  100 mg  100 mg Oral BID Max Sane, MD   100 mg at 08/18/19 1137  . enoxaparin (LOVENOX) injection 40 mg  40 mg Subcutaneous Q24H Max Sane, MD   40 mg at 08/18/19 2218  . feeding supplement (ENSURE ENLIVE) (ENSURE ENLIVE) liquid 237 mL  237 mL Oral TID BM Max Sane, MD      . HYDROcodone-acetaminophen (NORCO/VICODIN) 5-325 MG per tablet 1-2 tablet  1-2 tablet Oral Q4H PRN Max Sane, MD   1 tablet at 08/17/19 1659  . influenza vac split quadrivalent PF (FLUARIX) injection 0.5 mL  0.5 mL Intramuscular Tomorrow-1000 Manuella Ghazi, Vipul, MD      . levETIRAcetam (KEPPRA) IVPB 1000 mg/100 mL premix  1,000 mg Intravenous BID Max Sane, MD    Stopped at 08/18/19 2316  . meropenem (MERREM) 2 g in sodium chloride 0.9 % 100 mL IVPB  2 g Intravenous Q8H Rowland Lathe, RPH      . metoprolol succinate (TOPROL-XL) 24 hr tablet 25 mg  25 mg Oral Daily Max Sane, MD   25 mg at 08/18/19 1138  . multivitamin with minerals tablet 1 tablet  1 tablet Oral Daily Max Sane, MD   1 tablet at 08/18/19 1137  . ondansetron (ZOFRAN) tablet 4 mg  4 mg Oral Q6H PRN Max Sane, MD       Or  . ondansetron (ZOFRAN) injection 4 mg  4 mg Intravenous Q6H PRN Max Sane, MD      . ondansetron (ZOFRAN) tablet 8 mg  8 mg Oral BID Max Sane, MD   8 mg at 08/18/19 1138  . pantoprazole (PROTONIX) EC tablet 40 mg  40 mg Oral Daily Max Sane, MD   40 mg at 08/18/19 1136  . PARoxetine (PAXIL) tablet 20 mg  20 mg Oral Daily Max Sane, MD   20 mg at 08/18/19 1138  . pneumococcal 23 valent vaccine (PNU-IMMUNE) injection 0.5 mL  0.5 mL Intramuscular Tomorrow-1000 Max Sane, MD      . traZODone (DESYREL) tablet 25 mg  25 mg Oral QHS PRN Max Sane, MD      . Derrill Memo ON 08/20/2019] vancomycin (VANCOCIN) 1,250 mg in sodium chloride 0.9 % 250 mL IVPB  1,250 mg Intravenous Q24H Duncan, Asajah R, RPH        VITAL SIGNS: BP (!) 144/94   Pulse (!) 56   Temp 99.3 F (37.4 C) (Axillary)   Resp 15   Ht '5\' 4"'$  (1.626 m)   Wt 190 lb 7.6 oz (86.4 kg)   SpO2 98%   BMI 32.70 kg/m  Filed Weights   08/17/19 0500 08/18/19 0500 08/19/19 0500  Weight: 188 lb 0.8 oz (85.3 kg) 191 lb 2.2 oz (86.7 kg) 190 lb 7.6 oz (86.4 kg)    Estimated body mass index is 32.7 kg/m as calculated from the following:   Height as of this encounter: '5\' 4"'$  (1.626 m).   Weight as of this encounter: 190 lb 7.6 oz (86.4 kg).  LABS: CBC:    Component Value Date/Time   WBC 12.5 (H) 08/19/2019 0446   HGB 14.5 08/19/2019 0446   HCT 43.9 08/19/2019 0446   HCT 45.1 11/29/2012 0955   PLT 148 (L) 08/19/2019 0446   MCV 90.5 08/19/2019 0446   NEUTROABS 13.7 (H) 08/16/2019 0748   LYMPHSABS 1.4  08/16/2019 0748   MONOABS 1.0 08/16/2019 0748   EOSABS 0.3 08/16/2019 0748   BASOSABS 0.0 08/16/2019 0748   Comprehensive Metabolic Panel:  Component Value Date/Time   NA 138 08/19/2019 0446   K 4.1 08/19/2019 0446   CL 106 08/19/2019 0446   CO2 24 08/19/2019 0446   BUN 14 08/19/2019 0446   CREATININE 0.71 08/19/2019 0446   GLUCOSE 139 (H) 08/19/2019 0446   CALCIUM 9.0 08/19/2019 0446   AST 24 08/16/2019 0748   ALT 39 08/16/2019 0748   ALKPHOS 65 08/16/2019 0748   BILITOT 1.3 (H) 08/16/2019 0748   PROT 6.9 08/16/2019 0748   ALBUMIN 4.1 08/16/2019 0748    RADIOGRAPHIC STUDIES: Ct Head Wo Contrast  Result Date: 08/16/2019 CLINICAL DATA:  Altered mental status EXAM: CT HEAD WITHOUT CONTRAST TECHNIQUE: Contiguous axial images were obtained from the base of the skull through the vertex without intravenous contrast. COMPARISON:  06/08/2019 FINDINGS: Brain: Postoperative right temporal lobe. Superficial to the craniectomy flap is a lobulated tense appearing fluid collection in the scalp and upper masticator space, measuring up to 6 x 3.6 cm on axial slices. No acute hemorrhage, hydrocephalus, or intracranial mass effect. Vascular: No hyperdense vessel or unexpected calcification. Skull: Right pterional craniectomy with cranioplasty. Sinuses/Orbits: Negative IMPRESSION: 1. No acute intracranial finding. 2. 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 Electronically Signed   By: Monte Fantasia M.D.   On: 08/16/2019 08:57   [name] EB Contrast  Result Date: 08/18/2019 CLINICAL DATA:  Metastatic melanoma. Postop craniotomy. Altered mental status. EXAM: MRI HEAD WITHOUT AND WITH CONTRAST TECHNIQUE: Multiplanar, multiecho pulse sequences of the brain and surrounding structures were obtained without and with intravenous contrast. CONTRAST:  32m GADAVIST GADOBUTROL 1 MMOL/ML IV SOLN COMPARISON:  CT head 08/16/2019.  MRI head with contrast  06/08/2019 FINDINGS: Brain: Resection of tumor mass in the right temporal lobe since the prior MRI. Surgical encephalomalacia in the right inferior and anterior temporal lobe with irregular enhancement of the post surgical cavity in the right temporal lobe. Fluid extends through the craniotomy defect into the subgaleal tissues where there is a fluid collection measuring approximately 6.2 x 3.0 cm. 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 Significant progression in leptomeningeal enhancement diffusely and bilaterally. This also extends into the posterior fossa. Ventricle size normal.  No acute infarct.  No midline shift. Vascular: Normal arterial flow voids Skull and upper cervical spine: Right temporal craniotomy with pseudomeningocele as described above. Sinuses/Orbits: Negative Other: None IMPRESSION: Postsurgical resection of large right temporal lobe mass. Surgical fluid collection in the right temporal lobe extends through the craniotomy defect into the subgaleal tissues compatible with pseudomeningocele. Irregular enhancement of the leptomeninges has progressed significantly since 06/08/2019 compatible with leptomeningeal carcinomatosis. Electronically Signed   By: CFranchot GalloM.D.   On: 08/18/2019 17:45   Nm Pet Image Initial (pi) Whole Body  Result Date: 07/20/2019 CLINICAL DATA:  Initial treatment strategy for melanoma staging. Known brain metastasis. EXAM: NUCLEAR MEDICINE PET WHOLE BODY TECHNIQUE: 10.4 mCi F-18 FDG was injected intravenously. Full-ring PET imaging was performed from the skull base to thigh after the radiotracer. CT data was obtained and used for attenuation correction and anatomic localization. Fasting blood glucose: 110 mg/dl COMPARISON:  06/10/2019 chest abdomen and pelvic CTs. Brain MR 06/08/2019. FINDINGS: Mediastinal blood pool activity: SUV max 3.4 HEAD/NECK: Low-level hypermetabolism and soft tissue swelling about the right frontal scalp. Just  deep to this is the area of right temporal lobe resection. No cervical nodal hypermetabolism. Incidental CT findings: No cervical adenopathy. CHEST: No pulmonary parenchymal or thoracic nodal hypermetabolism. Incidental  CT findings: Deferred to recent diagnostic chest CT. No superimposed acute process. ABDOMEN/PELVIS: No abnormal abdominal hypermetabolism. An isolated left inguinal node measures 8 mm and a S.U.V. max of 2.4 on image 295/3. Incidental CT findings: Deferred to recent diagnostic CT. Cholecystectomy. Normal adrenal glands. Low-density right renal lesion was detailed on prior contrast enhanced CT, suboptimally evaluated. SKELETON: No abnormal marrow activity. Incidental CT findings: none EXTREMITIES: A focus of hypermetabolism about the right thenar eminence is suboptimally evaluated on CT. Incidental CT findings: Degenerative changes of both hips and sacroiliac joints. IMPRESSION: 1. No typical findings of hypermetabolic metastatic disease. 2. Hypermetabolism about the right thenar eminence warrants physical exam correlation to exclude site of primary melanoma. 3. Hypermetabolic left inguinal node is favored to be reactive and is not pathologic by size criteria. Recommend attention on follow-up. Electronically Signed   By: Abigail Miyamoto M.D.   On: 07/20/2019 14:56   Dg Chest Port 1 View  Result Date: 08/18/2019 CLINICAL DATA:  Somnolence. EXAM: PORTABLE CHEST 1 VIEW COMPARISON:  Radiograph 08/16/2019. PET CT 07/20/2019 FINDINGS: The cardiomediastinal contours are unchanged, upper normal heart size. No pulmonary edema. Slight increase in bibasilar atelectasis from prior. No consolidation, pleural effusion, or pneumothorax. No acute osseous abnormalities are seen. IMPRESSION: Slight increase in bibasilar atelectasis from prior. Otherwise no acute findings. Electronically Signed   By: Keith Rake M.D.   On: 08/18/2019 22:47   Dg Chest Portable 1 View  Result Date: 08/16/2019 CLINICAL DATA:   Altered mental status EXAM: PORTABLE CHEST 1 VIEW COMPARISON:  PET-CT July 20, 2019 FINDINGS: Lungs are clear. Heart is upper normal in size with pulmonary vascularity normal. No adenopathy. No bone lesions. IMPRESSION: No edema or consolidation. No evident adenopathy. Heart upper normal in size. Electronically Signed   By: Lowella Grip III M.D.   On: 08/16/2019 08:56    PERFORMANCE STATUS (ECOG) : 3 - Symptomatic, >50% confined to bed  Review of Systems Unless otherwise noted, a complete review of systems is negative.  Physical Exam General: Ill-appearing Cardiovascular: regular rate and rhythm Pulmonary: clear ant fields Abdomen: soft, nontender, + bowel sounds GU: no suprapubic tenderness Extremities: no edema, no joint deformities Skin: no rashes Neurological: Wakes when stimulated, answer simple questions, some confusion  IMPRESSION: Reportedly patient had increased confusion overnight but seems improved this morning.  She knows she is in the hospital due to cancer, although she could not tell me in which town the hospital was located.  I met privately with patient's husband.  He says that patient and her family did not seem to recognize the severity of the prognosis.  He knows that the recent MRI seems to suggest disease progression.  He says that he has been preparing himself as he knows his wife will eventually die of her cancer.  He asked me about hospice care and we talked about it as a future option to ensure the patient is comfortable either at home or in a residential hospice facility.  However, for now husband remains committed to the idea of treatment if any are available.    We discussed CODE STATUS.  He did not see him to want aggressive measures at end-of-life but said that he needed to speak with patient's parents, who he described as being "in denial."  We discussed the resource of [facility] for counseling support for his children.  He says that he thinks his  kids are doing okay and also recognize that patient's prognosis is poor.  Case discussed  with Dr. Tasia Catchings.   PLAN: -Continue current scope of treatment -Discussed with Dr. Tasia Catchings, who is recommending LP and neuro consult for further workup -Husband is considering code status  -Will follow   Time Total: 60 minutes  Visit consisted of counseling and education dealing with the complex and emotionally intense issues of symptom management and palliative care in the setting of serious and potentially life-threatening illness.Greater than 50%  of this time was spent counseling and coordinating care related to the above assessment and plan.  Signed by: Altha Harm, PhD, NP-C [phone] (Work Cell)

## 2019-08-20 LAB — CBC
HCT: 40.2 % (ref 36.0–46.0)
Hemoglobin: 13.7 g/dL (ref 12.0–15.0)
MCH: 30.5 pg (ref 26.0–34.0)
MCHC: 34.1 g/dL (ref 30.0–36.0)
MCV: 89.5 fL (ref 80.0–100.0)
Platelets: 153 10*3/uL (ref 150–400)
RBC: 4.49 MIL/uL (ref 3.87–5.11)
RDW: 13 % (ref 11.5–15.5)
WBC: 11.6 10*3/uL — ABNORMAL HIGH (ref 4.0–10.5)
nRBC: 0 % (ref 0.0–0.2)

## 2019-08-20 LAB — BASIC METABOLIC PANEL
Anion gap: 8 (ref 5–15)
BUN: 16 mg/dL (ref 6–20)
CO2: 24 mmol/L (ref 22–32)
Calcium: 8.9 mg/dL (ref 8.9–10.3)
Chloride: 106 mmol/L (ref 98–111)
Creatinine, Ser: 0.61 mg/dL (ref 0.44–1.00)
GFR calc Af Amer: >60 mL/min (ref >60)
GFR calc non Af Amer: >60 mL/min (ref >60)
Glucose, Bld: 135 mg/dL — ABNORMAL HIGH (ref 70–99)
Potassium: 4 mmol/L (ref 3.5–5.1)
Sodium: 138 mmol/L (ref 135–145)

## 2019-08-20 LAB — GLUCOSE, CAPILLARY: Glucose-Capillary: 117 mg/dL — ABNORMAL HIGH (ref 70–99)

## 2019-08-20 NOTE — Progress Notes (Signed)
Hematology/Oncology Progress Note [facility] Telephone:([phone] [phone]  Patient Care Team: Pleas Koch, NP as PCP - General (Nurse Practitioner)   Name of the patient: Vickie Waters  [id number]  1966/05/13  Date of visit: 08/20/19   INTERVAL HISTORY-  Patient is awake.  Mental status is at baseline.  Able to communicate well. Denies any headache, vision changes, denies nausea. Has tooth problems.   Review of systems- Review of Systems  Constitutional: Positive for fatigue. Negative for appetite change, chills and fever.  HENT:   Negative for hearing loss and voice change.   Eyes: Negative for eye problems.  Respiratory: Negative for chest tightness, cough and shortness of breath.   Cardiovascular: Negative for chest pain and leg swelling.  Gastrointestinal: Negative for abdominal distention, abdominal pain and blood in stool.  Endocrine: Negative for hot flashes.  Genitourinary: Negative for difficulty urinating, dysuria and frequency.   Musculoskeletal: Negative for arthralgias.  Skin: Negative for itching and rash.  Neurological: Negative for extremity weakness and headaches.  Hematological: Negative for adenopathy.  Psychiatric/Behavioral: Negative for confusion. The patient is not nervous/anxious.     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   Goals of care, counseling/discussion 07/15/2019   Altered taste 05/09/2019   Murmur, cardiac 03/04/2016   Preventative health care 03/04/2016   Type 2 diabetes mellitus ([place]) 03/04/2016    Vitamin D deficiency 03/04/2016   Migraine with aura and with status migrainosus, not intractable 11/15/2015   Essential hypertension 08/23/2015   Anxiety and depression 07/12/2015   Tobacco abuse 07/12/2015     Past Medical History:  Diagnosis Date   Allergy    Asthma    Cancer ([place])    Depression    Frequent headaches    Hypertension    Migraine      Past Surgical History:  Procedure Laterality Date   CHOLECYSTECTOMY  2001    Social History   Socioeconomic History   Marital status: Married    Spouse name: Not on file   Number of children: Not on file   Years of education: Not on file   Highest education level: Not on file  Occupational History   Not on file  Social Needs   Financial resource strain: Not hard at all   Food insecurity    Worry: Never true    Inability: Never true   Transportation needs    Medical: No    Non-medical: No  Tobacco Use   Smoking status: Current Every Day Smoker    Packs/day: 1.00    Types: Cigarettes   Smokeless tobacco: Never Used  Substance and Sexual Activity   Alcohol use: Yes    Alcohol/week: 0.0 standard drinks    Comment: social   Drug use: Never   Sexual activity: Not on file  Lifestyle   Physical activity    Days per week: Patient refused    Minutes per session: Patient refused   Stress: Not on file  Relationships   Social connections    Talks on phone: Patient refused    Gets  together: Patient refused    Attends religious service: Patient refused    Active member of club or organization: Patient refused    Attends meetings of clubs or organizations: Patient refused    Relationship status: Patient refused   Intimate partner violence    Fear of current or ex partner: No    Emotionally abused: No    Physically abused: No    Forced sexual activity: No  Other Topics Concern   Not on file  Social History Narrative   Married.   2 children   Works as an Scientist, water quality.    Enjoys spending time with her family.     Family History  Problem Relation Age of Onset   Arthritis Mother    Hypertension Mother    Hypertension Father    Diabetes Father    Mental illness Maternal Grandfather    Breast cancer Neg Hx      Current Facility-Administered Medications:    0.9 %  sodium chloride infusion, , Intravenous, Continuous, Max Sane, MD, Last Rate: 100 mL/hr at 08/20/19 1343   acetaminophen (TYLENOL) tablet 650 mg, 650 mg, Oral, Q6H PRN, 650 mg at 08/19/19 1600 **OR** acetaminophen (TYLENOL) suppository 650 mg, 650 mg, Rectal, Q6H PRN, Max Sane, MD, 650 mg at 08/18/19 2159   amLODipine (NORVASC) tablet 5 mg, 5 mg, Oral, Daily, Max Sane, MD, 5 mg at 08/19/19 1253   bisacodyl (DULCOLAX) EC tablet 5 mg, 5 mg, Oral, Daily PRN, Max Sane, MD   dexamethasone (DECADRON) injection 4 mg, 4 mg, Intravenous, Q8H, Manuella Ghazi, Vipul, MD, 4 mg at 08/20/19 1341   diphenhydrAMINE (BENADRYL) 12.5 MG/5ML elixir 12.5 mg, 12.5 mg, Oral, Q8H PRN, Tsosie Billing, MD, 12.5 mg at 08/20/19 1158   docusate sodium (COLACE) capsule 100 mg, 100 mg, Oral, BID, Max Sane, MD, 100 mg at 08/20/19 0936   enoxaparin (LOVENOX) injection 40 mg, 40 mg, Subcutaneous, Q24H, Manuella Ghazi, Vipul, MD, 40 mg at 08/19/19 2218   feeding supplement (ENSURE ENLIVE) (ENSURE ENLIVE) liquid 237 mL, 237 mL, Oral, TID BM, Max Sane, MD, 237 mL at 08/19/19 1602   HYDROcodone-acetaminophen (NORCO/VICODIN) 5-325 MG per tablet 1-2 tablet, 1-2 tablet, Oral, Q4H PRN, Max Sane, MD, 1 tablet at 08/17/19 1659   influenza vac split quadrivalent PF (FLUARIX) injection 0.5 mL, 0.5 mL, Intramuscular, Tomorrow-1000, Manuella Ghazi, Vipul, MD   levETIRAcetam (KEPPRA) IVPB 1000 mg/100 mL premix, 1,000 mg, Intravenous, BID, Max Sane, MD, Stopped at 08/20/19 1215   metoprolol succinate (TOPROL-XL) 24 hr tablet 25 mg, 25 mg, Oral, Daily, Manuella Ghazi, Vipul, MD, 25 mg at 08/19/19 1253   multivitamin with minerals tablet 1  tablet, 1 tablet, Oral, Daily, Max Sane, MD, 1 tablet at 08/20/19 0937   ondansetron (ZOFRAN) tablet 4 mg, 4 mg, Oral, Q6H PRN **OR** ondansetron (ZOFRAN) injection 4 mg, 4 mg, Intravenous, Q6H PRN, Max Sane, MD   ondansetron (ZOFRAN) tablet 8 mg, 8 mg, Oral, BID, Manuella Ghazi, Vipul, MD, 8 mg at 08/20/19 0936   pantoprazole (PROTONIX) EC tablet 40 mg, 40 mg, Oral, Daily, Max Sane, MD, 40 mg at 08/20/19 [id number]   PARoxetine (PAXIL) tablet 20 mg, 20 mg, Oral, Daily, Rowland Lathe, RPH, 20 mg at 08/20/19 0942   pneumococcal 23 valent vaccine (PNU-IMMUNE) injection 0.5 mL, 0.5 mL, Intramuscular, Tomorrow-1000, Shah, Vipul, MD   sodium chloride flush (NS) 0.9 % injection 10-40 mL, 10-40 mL, Intracatheter, PRN, Max Sane, MD   traZODone (DESYREL) tablet 25 mg, 25 mg, Oral, QHS PRN, Max Sane, MD, 25  mg at 08/19/19 2218   vancomycin (VANCOCIN) 1,250 mg in sodium chloride 0.9 % 250 mL IVPB, 1,250 mg, Intravenous, Q24H, Rowland Lathe, RPH, Stopped at 08/20/19 1119   Physical exam:  Vitals:   08/19/19 2030 08/20/19 0413 08/20/19 0933 08/20/19 1533  BP: 109/75 135/83 97/79 (!) 137/96  Pulse: 67 60  72  Resp: 20 16  20   Temp: 99.2 F (37.3 C) 98.4 F (36.9 C)  98.9 F (37.2 C)  TempSrc: Oral Oral    SpO2: 96% 98%  97%  Weight:  172 lb 6.4 oz (78.2 kg)    Height:       Physical Exam  Constitutional: She is oriented to person, place, and time. No distress.  HENT:  Mouth/Throat: No oropharyngeal exudate.  Right temple soft tissue swelling  Eyes: Pupils are equal, round, and reactive to light. EOM are normal. No scleral icterus.  Neck: Normal range of motion. Neck supple.  Cardiovascular: Normal rate and regular rhythm.  No murmur heard. Pulmonary/Chest: Effort normal and breath sounds normal. No respiratory distress. She has no rales. She exhibits no tenderness.  Abdominal: Soft. Bowel sounds are normal. She exhibits no distension. There is no abdominal tenderness.    Musculoskeletal: Normal range of motion.        General: No edema.  Neurological: She is alert and oriented to person, place, and time. No cranial nerve deficit. She exhibits normal muscle tone. Coordination normal.  Today she follows commands, orientated x2-3, answers questions appropriately  Skin: Skin is warm and dry. She is not diaphoretic. No erythema.  Psychiatric: Affect normal.       CMP Latest Ref Rng & Units 08/20/2019  Glucose 70 - 99 mg/dL 135(H)  BUN 6 - 20 mg/dL 16  Creatinine 0.44 - 1.00 mg/dL 0.61  Sodium 135 - 145 mmol/L 138  Potassium 3.5 - 5.1 mmol/L 4.0  Chloride 98 - 111 mmol/L 106  CO2 22 - 32 mmol/L 24  Calcium 8.9 - 10.3 mg/dL 8.9  Total Protein 6.5 - 8.1 g/dL -  Total Bilirubin 0.3 - 1.2 mg/dL -  Alkaline Phos 38 - 126 U/L -  AST 15 - 41 U/L -  ALT 0 - 44 U/L -   CBC Latest Ref Rng & Units 08/20/2019  WBC 4.0 - 10.5 K/uL 11.6(H)  Hemoglobin 12.0 - 15.0 g/dL 13.7  Hematocrit 36.0 - 46.0 % 40.2  Platelets 150 - 400 K/uL 153    RADIOGRAPHIC STUDIES: I have personally reviewed the radiological images as listed and agreed with the findings in the report.   Ct Head Wo Contrast  Result Date: 08/16/2019 CLINICAL DATA:  Altered mental status EXAM: CT HEAD WITHOUT CONTRAST TECHNIQUE: Contiguous axial images were obtained from the base of the skull through the vertex without intravenous contrast. COMPARISON:  06/08/2019 FINDINGS: Brain: Postoperative right temporal lobe. Superficial to the craniectomy flap is a lobulated tense appearing fluid collection in the scalp and upper masticator space, measuring up to 6 x 3.6 cm on axial slices. No acute hemorrhage, hydrocephalus, or intracranial mass effect. Vascular: No hyperdense vessel or unexpected calcification. Skull: Right pterional craniectomy with cranioplasty. Sinuses/Orbits: Negative IMPRESSION: 1. No acute intracranial finding. 2. Right temporal lobe mass resection with large pseudomeningocele overlying the  cranioplasty plate. Electronically Signed   By: Monte Fantasia M.D.   On: 08/16/2019 08:57   [name] [id number] Contrast  Result Date: 08/18/2019 CLINICAL DATA:  Metastatic melanoma. Postop craniotomy. Altered mental status. EXAM: MRI HEAD WITHOUT  AND WITH CONTRAST TECHNIQUE: Multiplanar, multiecho pulse sequences of the brain and surrounding structures were obtained without and with intravenous contrast. CONTRAST:  61mL GADAVIST GADOBUTROL 1 MMOL/ML IV SOLN COMPARISON:  CT head 08/16/2019.  MRI head with contrast 06/08/2019 FINDINGS: Brain: Resection of tumor mass in the right temporal lobe since the prior MRI. Surgical encephalomalacia in the right inferior and anterior temporal lobe with irregular enhancement of the post surgical cavity in the right temporal lobe. Fluid extends through the craniotomy defect into the subgaleal tissues where there is a fluid collection measuring approximately 6.2 x 3.0 cm. 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 Significant progression in leptomeningeal enhancement diffusely and bilaterally. This also extends into the posterior fossa. Ventricle size normal.  No acute infarct.  No midline shift. Vascular: Normal arterial flow voids Skull and upper cervical spine: Right temporal craniotomy with pseudomeningocele as described above. Sinuses/Orbits: Negative Other: None IMPRESSION: Postsurgical resection of large right temporal lobe mass. Surgical fluid collection in the right temporal lobe extends through the craniotomy defect into the subgaleal tissues compatible with pseudomeningocele. Irregular enhancement of the leptomeninges has progressed significantly since 06/08/2019 compatible with leptomeningeal carcinomatosis. Electronically Signed   By: Franchot Gallo M.D.   On: 08/18/2019 17:45   Dg Chest Port 1 View  Result Date: 08/18/2019 CLINICAL DATA:  Somnolence. EXAM: PORTABLE CHEST 1 VIEW COMPARISON:  Radiograph 08/16/2019. PET CT 07/20/2019  FINDINGS: The cardiomediastinal contours are unchanged, upper normal heart size. No pulmonary edema. Slight increase in bibasilar atelectasis from prior. No consolidation, pleural effusion, or pneumothorax. No acute osseous abnormalities are seen. IMPRESSION: Slight increase in bibasilar atelectasis from prior. Otherwise no acute findings. Electronically Signed   By: Keith Rake M.D.   On: 08/18/2019 22:47   Dg Chest Portable 1 View  Result Date: 08/16/2019 CLINICAL DATA:  Altered mental status EXAM: PORTABLE CHEST 1 VIEW COMPARISON:  PET-CT July 20, 2019 FINDINGS: Lungs are clear. Heart is upper normal in size with pulmonary vascularity normal. No adenopathy. No bone lesions. IMPRESSION: No edema or consolidation. No evident adenopathy. Heart upper normal in size. Electronically Signed   By: Lowella Grip III M.D.   On: 08/16/2019 08:56    Assessment and plan-  Patient is a 53 y.o. female with history of stage IV melanoma with brain involvement currently under going radiation presented with acute change of mental status, nausea vomiting.  #Stage IV melanoma with brain involvement, status post open brain biopsy currently on radiation. #Acute change of mental status, intermittent,  Mental status stable.  No additional confusion episodes. Episodes of confusion probably secondary to leptomeningeal carcinomatosis, though infectious etiology cannot be ruled out. Lumbar puncture was held due to high risk of herniation per neurology. Clinically she is doing well after increased Decadron to 4 mg 3 times daily. Continue monitor. Low-grade fever, improved.  On antibiotics.  Per ID management. If mental status continues to be stable, from oncology standpoint of view, she can be released to home with outpatient follow-up with Dr. Grayland Ormond to start immunotherapy nivolumab plus ipilimumab.  Patient and family member has multiple questions and all questions answered to their satisfaction. Thank  you for allowing me to participate in the care of this patient.   Earlie Server, MD, PhD Hematology Oncology [facility] at [facility] Pager- [phone] 08/20/2019

## 2019-08-20 NOTE — Progress Notes (Signed)
[place] [facility] [place] NAME: Vickie Waters    MR#:  [id number]  DATE OF BIRTH:  [birth year]  SUBJECTIVE:  CHIEF COMPLAINT:   Chief Complaint  Patient presents with  . Altered Mental Status   Patient awake back to mental status   REVIEW OF SYSTEMS:   CONSTITUTIONAL: No documented fever. No fatigue, weakness. No weight gain, no weight loss.  EYES: No blurry or double vision.  ENT: No tinnitus. No postnasal drip. No redness of the oropharynx.  RESPIRATORY: No cough, no wheeze, no hemoptysis. No dyspnea.  CARDIOVASCULAR: No chest pain. No orthopnea. No palpitations. No syncope.  GASTROINTESTINAL: No nausea, no vomiting or diarrhea. No abdominal pain. No melena or hematochezia.  GENITOURINARY:  No urgency. No frequency. No dysuria. No hematuria. No obstructive symptoms. No discharge. No pain. No significant abnormal bleeding ENDOCRINE: No polyuria or nocturia. No heat or cold intolerance.  HEMATOLOGY: No anemia. No bruising. No bleeding. No purpura. No petechiae INTEGUMENTARY: No rashes. No lesions.  MUSCULOSKELETAL: No arthritis. No swelling. No gout.  NEUROLOGIC: No numbness, tingling, or ataxia. No seizure-type activity.  PSYCHIATRIC: No anxiety. No insomnia. No ADD.    DRUG ALLERGIES:   Allergies  Allergen Reactions  . Meropenem Hives    Within 3 hours of adminstration  . Cephalosporins Hives  . Penicillins Hives   VITALS:  Blood pressure 97/79, pulse 60, temperature 98.4 F (36.9 C), temperature source Oral, resp. rate 16, height 5\' 4"  (1.626 m), weight 78.2 kg, SpO2 98 %. PHYSICAL EXAMINATION:  Physical Exam HENT:     Head: Normocephalic and atraumatic.  Eyes:     Conjunctiva/sclera: Conjunctivae normal.     Pupils: Pupils are equal, round, and reactive to light.  Neck:     Musculoskeletal: Normal range of motion and neck supple.     Thyroid: No thyromegaly.     Trachea: No tracheal deviation.  Cardiovascular:     Rate and Rhythm: Normal rate and regular rhythm.     Heart sounds: Normal heart sounds.  Pulmonary:     Effort: Pulmonary effort is normal. No respiratory distress.     Breath sounds: Normal breath sounds. No wheezing.  Chest:     Chest wall: No tenderness.  Abdominal:     General: Bowel sounds are normal. There is no distension.     Palpations: Abdomen is soft.     Tenderness: There is no abdominal tenderness.  Musculoskeletal: Normal range of motion.  Skin:    General: Skin is warm and dry.     Findings: No rash.  Neurological:     Mental Status: She is lethargic.     Cranial Nerves: No cranial nerve deficit.    LABORATORY PANEL:  Female CBC Recent Labs  Lab 08/20/19 0555  WBC 11.6*  HGB 13.7  HCT 40.2  PLT 153   ------------------------------------------------------------------------------------------------------------------ Chemistries  Recent Labs  Lab 08/16/19 0748  08/20/19 0555  NA 140   < > 138  K 3.8   < > 4.0  CL 99   < > 106  CO2 30   < > 24  GLUCOSE 120*   < > 135*  BUN 29*   < > 16  CREATININE 0.94   < > 0.61  CALCIUM 9.1   < > 8.9  AST 24  --   --   ALT 39  --   --   ALKPHOS 65  --   --  BILITOT 1.3*  --   --    < > = values in this interval not displayed.   RADIOLOGY:  No results found. 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  -Followed by Dr. Grayland Ormond -getting chemo, XRT and may be immunotherapy -Next follow-up appointment at cancer clinic  on Thursday, October 8 initiate cycle 1 of combination immunotherapy.  -Brain biopsy: Patient has appointment at [facility] on August 23, 2019 for continued postsurgical follow-up. - CT head shows Right temporal lobe mass resection with large pseudomeningocele overlying the cranioplasty plate -Prognosis poor  *Possible encephalitis/infected fluid -continue IV antibiotics No LP due to concern for herniation  *Fever suspected due to underlying malignancy  *Positive blood cultures this is contamination due to coag negative staph    All the records are reviewed and case discussed with Care Management/Social Worker. Management plans discussed with the patient, family (discussed with husband Shital Dewaard at bedside and they are in agreement.  CODE STATUS: Full Code  TOTAL TIME TAKING CARE OF THIS PATIENT: 35 minutes.   More than 50% of the time was spent in counseling/coordination of care: YES  POSSIBLE D/C IN 1-2 DAYS, DEPENDING ON CLINICAL CONDITION.   Dustin Flock M.D on 08/20/2019 at 2:45 PM  Between 7am to 6pm - Pager - [phone]  After 6pm go to [email or web address] - Proofreader  [employer] [facility]  Office  [phone]  CC: Primary care physician; Pleas Koch, NP  Note: This dictation was prepared with Dragon dictation along with smaller phrase technology. Any transcriptional errors that result from this process are unintentional.

## 2019-08-21 LAB — GLUCOSE, CAPILLARY
Glucose-Capillary: 104 mg/dL — ABNORMAL HIGH (ref 70–99)
Glucose-Capillary: 127 mg/dL — ABNORMAL HIGH (ref 70–99)

## 2019-08-21 LAB — CREATININE, SERUM
Creatinine, Ser: 0.74 mg/dL (ref 0.44–1.00)
GFR calc Af Amer: >60 mL/min (ref >60)
GFR calc non Af Amer: >60 mL/min (ref >60)

## 2019-08-21 LAB — CULTURE, BLOOD (ROUTINE X 2)
Culture: NO GROWTH
Special Requests: ADEQUATE

## 2019-08-21 MED ORDER — LEVETIRACETAM 500 MG PO TABS
1000.0000 mg | ORAL_TABLET | Freq: Two times a day (BID) | ORAL | Status: DC
  Administered 2019-08-21 – 2019-08-22 (×2): 1000 mg via ORAL
  Filled 2019-08-21 (×3): qty 2

## 2019-08-21 MED ORDER — POLYETHYLENE GLYCOL 3350 17 G PO PACK
17.0000 g | PACK | Freq: Every day | ORAL | Status: DC
  Administered 2019-08-21 – 2019-08-22 (×2): 17 g via ORAL
  Filled 2019-08-21 (×2): qty 1

## 2019-08-21 MED ORDER — LACTULOSE 10 GM/15ML PO SOLN
30.0000 g | Freq: Two times a day (BID) | ORAL | Status: DC
  Administered 2019-08-21 – 2019-08-22 (×3): 30 g via ORAL
  Filled 2019-08-21 (×3): qty 60

## 2019-08-21 MED ORDER — FLEET ENEMA 7-19 GM/118ML RE ENEM: 1.0000 | ENEMA | Freq: Every day | RECTAL | Status: DC | PRN

## 2019-08-21 MED ORDER — MEGESTROL ACETATE 400 MG/10ML PO SUSP
400.0000 mg | Freq: Every day | ORAL | Status: DC
  Administered 2019-08-22: 400 mg via ORAL
  Filled 2019-08-21 (×2): qty 10

## 2019-08-21 NOTE — Progress Notes (Signed)
[place] [facility] [place] NAME: Shivika Engelhard    MR#:  [id number]  DATE OF BIRTH:  1966/10/23  SUBJECTIVE:  CHIEF COMPLAINT:   Chief Complaint  Patient presents with  . Altered Mental Status   Patient doing better mother at bedside concerned about her discharge  REVIEW OF SYSTEMS:   CONSTITUTIONAL: No documented fever. No fatigue, weakness. No weight gain, no weight loss.  EYES: No blurry or double vision.  ENT: No tinnitus. No postnasal drip. No redness of the oropharynx.  RESPIRATORY: No cough, no wheeze, no hemoptysis. No dyspnea.  CARDIOVASCULAR: No chest pain. No orthopnea. No palpitations. No syncope.  GASTROINTESTINAL: No nausea, no vomiting or diarrhea. No abdominal pain. No melena or hematochezia.  GENITOURINARY:  No urgency. No frequency. No dysuria. No hematuria. No obstructive symptoms. No discharge. No pain. No significant abnormal bleeding ENDOCRINE: No polyuria or nocturia. No heat or cold intolerance.  HEMATOLOGY: No anemia. No bruising. No bleeding. No purpura. No petechiae INTEGUMENTARY: No rashes. No lesions.  MUSCULOSKELETAL: No arthritis. No swelling. No gout.  NEUROLOGIC: No numbness, tingling, or ataxia. No seizure-type activity.  PSYCHIATRIC: No anxiety. No insomnia. No ADD.    DRUG ALLERGIES:   Allergies  Allergen Reactions  . Meropenem Hives    Within 3 hours of adminstration  . Cephalosporins Hives  . Penicillins Hives   VITALS:  Blood pressure (!) 154/97, pulse 68, temperature 98.9 F (37.2 C), temperature source Oral, resp. rate 16, height 5\' 4"  (1.626 m), weight 85.8 kg, SpO2 97 %. PHYSICAL EXAMINATION:  Physical Exam HENT:     Head: Normocephalic and atraumatic.  Eyes:     Conjunctiva/sclera: Conjunctivae normal.     Pupils: Pupils are equal, round, and reactive to light.  Neck:     Musculoskeletal: Normal range of motion and neck supple.     Thyroid: No thyromegaly.     Trachea: No  tracheal deviation.  Cardiovascular:     Rate and Rhythm: Normal rate and regular rhythm.     Heart sounds: Normal heart sounds.  Pulmonary:     Effort: Pulmonary effort is normal. No respiratory distress.     Breath sounds: Normal breath sounds. No wheezing.  Chest:     Chest wall: No tenderness.  Abdominal:     General: Bowel sounds are normal. There is no distension.     Palpations: Abdomen is soft.     Tenderness: There is no abdominal tenderness.  Musculoskeletal: Normal range of motion.  Skin:    General: Skin is warm and dry.     Findings: No rash.  Neurological:     Mental Status: She is alert and oriented to person, place, and time.     Cranial Nerves: No cranial nerve deficit.    LABORATORY PANEL:  Female CBC Recent Labs  Lab 08/20/19 0555  WBC 11.6*  HGB 13.7  HCT 40.2  PLT 153   ------------------------------------------------------------------------------------------------------------------ Chemistries  Recent Labs  Lab 08/16/19 0748  08/20/19 0555 08/21/19 0512  NA 140   < > 138  --   K 3.8   < > 4.0  --   CL 99   < > 106  --   CO2 30   < > 24  --   GLUCOSE 120*   < > 135*  --   BUN 29*   < > 16  --   CREATININE 0.94   < >  0.61 0.74  CALCIUM 9.1   < > 8.9  --   AST 24  --   --   --   ALT 39  --   --   --   ALKPHOS 65  --   --   --   BILITOT 1.3*  --   --   --    < > = values in this interval not displayed.   RADIOLOGY:  No results found. 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  -Followed by Dr. Grayland Ormond -getting chemo, XRT and may be immunotherapy -Next follow-up appointment at cancer clinic on Thursday, October 8 initiate cycle 1 of combination immunotherapy.  -Brain biopsy: Patient has appointment at [facility] on August 23, 2019 for continued postsurgical follow-up. - CT head shows Right temporal lobe mass resection with large pseudomeningocele overlying the cranioplasty plate -Prognosis poor discussed with the husband according to neurology patient has few weeks with this diagnosis  *Possible encephalitis/infected fluid -continue IV antibiotics No LP due to concern for herniation  *Fever suspected due to underlying malignancy stop IV antibiotics  *Positive blood cultures this is contamination due to coag negative staph    All the records are reviewed and case discussed with Care Management/Social Worker. Management plans discussed with the patient, family (discussed with husband Teddie Helvey at bedside and they are in agreement.  CODE STATUS: Full Code  TOTAL TIME TAKING CARE OF THIS PATIENT: 35 minutes.   More than 50% of the time was spent in counseling/coordination of care: YES  POSSIBLE D/C IN 1-2 DAYS, DEPENDING ON CLINICAL CONDITION.   Dustin Flock M.D on 08/21/2019 at 12:00 PM  Between 7am to 6pm - Pager - [phone]  After 6pm go to [email or web address] - Proofreader  [facility]  Office  [phone]  CC: Primary care physician; Pleas Koch, NP  Note: This dictation was prepared with Dragon dictation along with smaller phrase technology. Any transcriptional errors that result from this process are unintentional.

## 2019-08-21 NOTE — Progress Notes (Deleted)
[place]  [phone] [phone]  ID: Vickie Waters OB: 1966/08/19  MR#: [id number]  RFX#:[id number]  Patient Care Team: Pleas Koch, NP as PCP - General (Nurse Practitioner)  CHIEF COMPLAINT: Metastatic melanoma, BRAF-.  INTERVAL HISTORY: Patient returns to clinic today for further evaluation and treatment planning.  She has noted increased fatigue, but otherwise is tolerating her XRT well.  She continues to have swelling and tenderness in the right temporal area at the site of her surgery.  She has no neurologic complaints.  She denies any recent fevers or illnesses.  She has a good appetite and denies weight loss.  She has no chest pain, shortness of breath, cough, or hemoptysis.  She denies any nausea, vomiting, constipation, or diarrhea.  She has no urinary complaints.  Patient offers no further specific complaints today.  REVIEW OF SYSTEMS:   Review of Systems  Constitutional: Positive for malaise/fatigue. Negative for fever and weight loss.  HENT: Negative.  Negative for hearing loss, sinus pain, sore throat and tinnitus.   Eyes: Negative.  Negative for blurred vision and double vision.  Respiratory: Negative.  Negative for cough, hemoptysis and shortness of breath.   Cardiovascular: Negative.  Negative for chest pain and leg swelling.  Gastrointestinal: Negative.  Negative for abdominal pain, blood in stool and melena.  Genitourinary: Negative.  Negative for dysuria and hematuria.  Musculoskeletal: Negative.  Negative for back pain and neck pain.  Skin: Negative.  Negative for rash.  Neurological: Negative.  Negative for dizziness, sensory change, speech change, focal weakness, seizures, weakness and headaches.  Psychiatric/Behavioral: The patient is nervous/anxious.     As per HPI. Otherwise, a complete review of systems is negative.  PAST MEDICAL HISTORY: Past Medical History:  Diagnosis Date   Allergy    Asthma     Cancer ([place])    Depression    Frequent headaches    Hypertension    Migraine     PAST SURGICAL HISTORY: Past Surgical History:  Procedure Laterality Date   CHOLECYSTECTOMY  2001    FAMILY HISTORY: Family History  Problem Relation Age of Onset   Arthritis Mother    Hypertension Mother    Hypertension Father    Diabetes Father    Mental illness Maternal Grandfather    Breast cancer Neg Hx     ADVANCED DIRECTIVES (Y/N):  N  HEALTH MAINTENANCE: Social History   Tobacco Use   Smoking status: Current Every Day Smoker    Packs/day: 1.00    Types: Cigarettes   Smokeless tobacco: Never Used  Substance Use Topics   Alcohol use: Yes    Alcohol/week: 0.0 standard drinks    Comment: social   Drug use: Never     Colonoscopy:  PAP:  Bone density:  Lipid panel:  Allergies  Allergen Reactions   Meropenem Hives    Within 3 hours of adminstration   Cephalosporins Hives   Penicillins Hives    No current facility-administered medications for this visit.    No current outpatient medications on file.   Facility-Administered Medications Ordered in Other Visits  Medication Dose Route Frequency Provider Last Rate Last Dose   0.9 %  sodium chloride infusion   Intravenous Continuous Max Sane, MD 100 mL/hr at 08/21/19 1029     acetaminophen (TYLENOL) tablet 650 mg  650 mg Oral Q6H PRN Max Sane, MD   650 mg at 08/19/19 1600   Or   acetaminophen (TYLENOL) suppository 650 mg  650 mg  Rectal Q6H PRN Max Sane, MD   650 mg at 08/18/19 2159   amLODipine (NORVASC) tablet 5 mg  5 mg Oral Daily Max Sane, MD   5 mg at 08/21/19 1035   bisacodyl (DULCOLAX) EC tablet 5 mg  5 mg Oral Daily PRN Max Sane, MD       dexamethasone (DECADRON) injection 4 mg  4 mg Intravenous Q8H Max Sane, MD   4 mg at 08/21/19 0631   docusate sodium (COLACE) capsule 100 mg  100 mg Oral BID Max Sane, MD   100 mg at 08/21/19 1035   enoxaparin (LOVENOX) injection 40 mg  40  mg Subcutaneous Q24H Max Sane, MD   40 mg at 08/20/19 2105   feeding supplement (ENSURE ENLIVE) (ENSURE ENLIVE) liquid 237 mL  237 mL Oral TID BM Max Sane, MD   237 mL at 08/20/19 2105   HYDROcodone-acetaminophen (NORCO/VICODIN) 5-325 MG per tablet 1-2 tablet  1-2 tablet Oral Q4H PRN Max Sane, MD   1 tablet at 08/17/19 1659   influenza vac split quadrivalent PF (FLUARIX) injection 0.5 mL  0.5 mL Intramuscular Tomorrow-1000 Max Sane, MD       levETIRAcetam (KEPPRA) IVPB 1000 mg/100 mL premix  1,000 mg Intravenous BID Max Sane, MD   Stopped at 08/21/19 0523   metoprolol succinate (TOPROL-XL) 24 hr tablet 25 mg  25 mg Oral Daily Max Sane, MD   25 mg at 08/21/19 1034   multivitamin with minerals tablet 1 tablet  1 tablet Oral Daily Max Sane, MD   1 tablet at 08/21/19 1034   ondansetron (ZOFRAN) tablet 4 mg  4 mg Oral Q6H PRN Max Sane, MD       Or   ondansetron [facility]) injection 4 mg  4 mg Intravenous Q6H PRN Max Sane, MD       ondansetron (ZOFRAN) tablet 8 mg  8 mg Oral BID Max Sane, MD   8 mg at 08/21/19 1035   pantoprazole (PROTONIX) EC tablet 40 mg  40 mg Oral Daily Max Sane, MD   40 mg at 08/21/19 1035   PARoxetine (PAXIL) tablet 20 mg  20 mg Oral Daily Rowland Lathe, RPH   20 mg at 08/21/19 1035   pneumococcal 23 valent vaccine (PNU-IMMUNE) injection 0.5 mL  0.5 mL Intramuscular Tomorrow-1000 Max Sane, MD       sodium chloride flush (NS) 0.9 % injection 10-40 mL  10-40 mL Intracatheter PRN Max Sane, MD       traZODone (DESYREL) tablet 25 mg  25 mg Oral QHS PRN Max Sane, MD   25 mg at 08/19/19 2218   vancomycin (VANCOCIN) 1,250 mg in sodium chloride 0.9 % 250 mL IVPB  1,250 mg Intravenous Q24H Rowland Lathe, RPH 166.7 mL/hr at 08/21/19 1032 1,250 mg at 08/21/19 1032    OBJECTIVE: There were no vitals filed for this visit.   There is no height or weight on file to calculate BMI.    ECOG FS:0 - Asymptomatic  General: Well-developed,  well-nourished, no acute distress. Eyes: Pink conjunctiva, anicteric sclera. HEENT: Normocephalic, moist mucous membranes, clear oropharnyx.  Right temporal swelling. Lungs: Clear to auscultation bilaterally. Heart: Regular rate and rhythm. No rubs, murmurs, or gallops. Abdomen: Soft, nontender, nondistended. No organomegaly noted, normoactive bowel sounds. Musculoskeletal: No edema, cyanosis, or clubbing. Neuro: Alert, answering all questions appropriately. Cranial nerves grossly intact. Skin: No rashes or petechiae noted. Psych: Normal affect.  LAB RESULTS:  Lab Results  Component Value Date  NA 138 08/20/2019   K 4.0 08/20/2019   CL 106 08/20/2019   CO2 24 08/20/2019   GLUCOSE 135 (H) 08/20/2019   BUN 16 08/20/2019   CREATININE 0.74 08/21/2019   CALCIUM 8.9 08/20/2019   PROT 6.9 08/16/2019   ALBUMIN 4.1 08/16/2019   AST 24 08/16/2019   ALT 39 08/16/2019   ALKPHOS 65 08/16/2019   BILITOT 1.3 (H) 08/16/2019   GFRNONAA >60 08/21/2019   GFRAA >60 08/21/2019    Lab Results  Component Value Date   WBC 11.6 (H) 08/20/2019   NEUTROABS 13.7 (H) 08/16/2019   HGB 13.7 08/20/2019   HCT 40.2 08/20/2019   MCV 89.5 08/20/2019   PLT 153 08/20/2019     STUDIES: Ct Head Wo Contrast  Result Date: 08/16/2019 CLINICAL DATA:  Altered mental status EXAM: CT HEAD WITHOUT CONTRAST TECHNIQUE: Contiguous axial images were obtained from the base of the skull through the vertex without intravenous contrast. COMPARISON:  06/08/2019 FINDINGS: Brain: Postoperative right temporal lobe. Superficial to the craniectomy flap is a lobulated tense appearing fluid collection in the scalp and upper masticator space, measuring up to 6 x 3.6 cm on axial slices. No acute hemorrhage, hydrocephalus, or intracranial mass effect. Vascular: No hyperdense vessel or unexpected calcification. Skull: Right pterional craniectomy with cranioplasty. Sinuses/Orbits: Negative IMPRESSION: 1. No acute intracranial finding.  2. 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 Electronically Signed   By: Monte Fantasia M.D.   On: 08/16/2019 08:57   [name] QB Contrast  Result Date: 08/18/2019 CLINICAL DATA:  Metastatic melanoma. Postop craniotomy. Altered mental status. EXAM: MRI HEAD WITHOUT AND WITH CONTRAST TECHNIQUE: Multiplanar, multiecho pulse sequences of the brain and surrounding structures were obtained without and with intravenous contrast. CONTRAST:  43m GADAVIST GADOBUTROL 1 MMOL/ML IV SOLN COMPARISON:  CT head 08/16/2019.  MRI head with contrast 06/08/2019 FINDINGS: Brain: Resection of tumor mass in the right temporal lobe since the prior MRI. Surgical encephalomalacia in the right inferior and anterior temporal lobe with irregular enhancement of the post surgical cavity in the right temporal lobe. Fluid extends through the craniotomy defect into the subgaleal tissues where there is a fluid collection measuring approximately 6.2 x 3.0 cm. 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 Significant progression in leptomeningeal enhancement diffusely and bilaterally. This also extends into the posterior fossa. Ventricle size normal.  No acute infarct.  No midline shift. Vascular: Normal arterial flow voids Skull and upper cervical spine: Right temporal craniotomy with pseudomeningocele as described above. Sinuses/Orbits: Negative Other: None IMPRESSION: Postsurgical resection of large right temporal lobe mass. Surgical fluid collection in the right temporal lobe extends through the craniotomy defect into the subgaleal tissues compatible with pseudomeningocele. Irregular enhancement of the leptomeninges has progressed significantly since 06/08/2019 compatible with leptomeningeal carcinomatosis. Electronically Signed   By: CFranchot GalloM.D.   On: 08/18/2019 17:45   Dg Chest Port 1 View  Result Date: 08/18/2019 CLINICAL DATA:  Somnolence. EXAM:  PORTABLE CHEST 1 VIEW COMPARISON:  Radiograph 08/16/2019. PET CT 07/20/2019 FINDINGS: The cardiomediastinal contours are unchanged, upper normal heart size. No pulmonary edema. Slight increase in bibasilar atelectasis from prior. No consolidation, pleural effusion, or pneumothorax. No acute osseous abnormalities are seen. IMPRESSION: Slight increase in bibasilar atelectasis from prior. Otherwise no acute findings. Electronically Signed   By: MKeith RakeM.D.   On: 08/18/2019 22:47   Dg Chest Portable 1 View  Result Date: 08/16/2019 CLINICAL DATA:  Altered  mental status EXAM: PORTABLE CHEST 1 VIEW COMPARISON:  PET-CT July 20, 2019 FINDINGS: Lungs are clear. Heart is upper normal in size with pulmonary vascularity normal. No adenopathy. No bone lesions. IMPRESSION: No edema or consolidation. No evident adenopathy. Heart upper normal in size. Electronically Signed   By: Lowella Grip III M.D.   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 PLAN:    1.  Metastatic melanoma:  Patient underwent open brain biopsy at [facility] on June 30, 2019.  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  BRAF mutation is negative.  PET scan results from July 20, 2019 reviewed independently and report as above with no hypermetabolic disease indicating a definitive primary.  Although there is no obvious site of disease, patient will still benefit from systemic therapy using ipilimumab and nivolumab every 3 weeks for 4 treatments then transitioning to maintenance nivolumab every 2 weeks.  Patient wishes to finish XRT and have a 1 to 2-week break prior to initiating immunotherapy, therefore she will return to clinic on Thursday, August 24, 2021 initiate cycle 1 of combination immunotherapy.  2.  Brain biopsy: Patient has appointment at [facility] on August 23, 2019 for continued postsurgical follow-up.  I spent a total of 30 minutes  face-to-face with the patient of which greater than 50% of the visit was spent in counseling and coordination of care as detailed above.   Patient expressed understanding and was in agreement with this plan. She also understands that She can call clinic at any time with any questions, concerns, or complaints.   Cancer Staging Malignant melanoma metastatic to brain [facility]) Staging form: Melanoma of the Skin, AJCC 8th Edition - Clinical stage from 07/15/2019: Stage IV (cTX, cNX, cM1d(0)) - Signed by Lloyd Huger, MD on 07/15/2019   Lloyd Huger, MD   08/21/2019 11:00 AM

## 2019-08-21 NOTE — Progress Notes (Signed)
PHARMACIST - PHYSICIAN COMMUNICATION  CONCERNING: IV to Oral Route Change Policy  RECOMMENDATION: This patient is receiving levetiracetam by the intravenous route.  Based on criteria approved by the Pharmacy and Therapeutics Committee, the intravenous medication(s) is/are being converted to the equivalent oral dose form(s).   DESCRIPTION: These criteria include:  The patient is eating (either orally or via tube) and/or has been taking other orally administered medications for a least 24 hours  The patient has no evidence of active gastrointestinal bleeding or impaired GI absorption (gastrectomy, short bowel, patient on TNA or NPO).   Olivia Canter, [facility] 08/21/2019 2:13 PM

## 2019-08-22 ENCOUNTER — Telehealth: Payer: Self-pay | Admitting: *Deleted

## 2019-08-22 LAB — GLUCOSE, CAPILLARY: Glucose-Capillary: 131 mg/dL — ABNORMAL HIGH (ref 70–99)

## 2019-08-22 MED ORDER — ENSURE ENLIVE PO LIQD: 237.0000 mL | Freq: Three times a day (TID) | ORAL | 12 refills | Status: AC

## 2019-08-22 MED ORDER — DOCUSATE SODIUM 100 MG PO CAPS: 100.0000 mg | ORAL_CAPSULE | Freq: Two times a day (BID) | ORAL | 0 refills | Status: AC

## 2019-08-22 MED ORDER — POLYETHYLENE GLYCOL 3350 17 G PO PACK: 17.0000 g | PACK | Freq: Every day | ORAL | 0 refills | Status: AC

## 2019-08-22 MED ORDER — DEXAMETHASONE 4 MG PO TABS: 4.0000 mg | ORAL_TABLET | Freq: Three times a day (TID) | ORAL | 0 refills | Status: AC

## 2019-08-22 NOTE — Progress Notes (Signed)
[place]  [phone] [phone]   Name: Vickie Waters Date: 08/22/2019 MRN: [id number]  DOB: 10-Jan-1966  Patient Care Team: Pleas Koch, NP as PCP - General (Nurse Practitioner)    REASON FOR CONSULTATION: Palliative Care consult requested for this 53 y.o. female with multiple medical problems including metastatic melanoma of the brain s/p open brain biopsy at [facility] on 06/30/19. She was started on XRT with plan to start on immunotherapy. She was admitted to hospital 08/16/2019 with altered mental status and nausea and vomiting.  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  Palliative care was consulted to help address goals.  CODE STATUS: Full code  PAST MEDICAL HISTORY: Past Medical History:  Diagnosis Date   Allergy    Asthma    Cancer ([place])    Depression    Frequent headaches    Hypertension    Migraine     PAST SURGICAL HISTORY:  Past Surgical History:  Procedure Laterality Date   CHOLECYSTECTOMY  2001    HEMATOLOGY/ONCOLOGY HISTORY:  Oncology History  Malignant melanoma metastatic to brain ([place])  07/15/2019 Initial Diagnosis   Malignant melanoma metastatic to brain ([place])   07/15/2019 Cancer Staging   Staging form: Melanoma of the Skin, AJCC 8th Edition - Clinical stage from 07/15/2019: Stage IV (cTX, cNX, cM1d(0)) - Signed by Lloyd Huger, MD on 07/15/2019   08/25/2019 -  Chemotherapy   The patient had ipilimumab (YERVOY) 250 mg in sodium chloride 0.9 % 100 mL chemo infusion, 3 mg/kg = 250 mg, Intravenous,  Once, 0 of 4 cycles nivolumab (OPDIVO) 84 mg in sodium chloride 0.9 % 50 mL chemo infusion, 1 mg/kg = 84 mg, Intravenous, Once, 0 of 10 cycles  for chemotherapy treatment.      ALLERGIES:  is allergic to meropenem; cephalosporins; and penicillins.  MEDICATIONS:  Current Facility-Administered Medications  Medication Dose Route Frequency  Provider Last Rate Last Dose   acetaminophen (TYLENOL) tablet 650 mg  650 mg Oral Q6H PRN Max Sane, MD   650 mg at 08/19/19 1600   Or   acetaminophen (TYLENOL) suppository 650 mg  650 mg Rectal Q6H PRN Max Sane, MD   650 mg at 08/18/19 2159   amLODipine (NORVASC) tablet 5 mg  5 mg Oral Daily Max Sane, MD   5 mg at 08/22/19 1058   bisacodyl (DULCOLAX) EC tablet 5 mg  5 mg Oral Daily PRN Max Sane, MD   5 mg at 08/21/19 1212   dexamethasone (DECADRON) injection 4 mg  4 mg Intravenous Q8H Max Sane, MD   4 mg at 08/22/19 0537   docusate sodium (COLACE) capsule 100 mg  100 mg Oral BID Max Sane, MD   100 mg at 08/22/19 1058   enoxaparin (LOVENOX) injection 40 mg  40 mg Subcutaneous Q24H Max Sane, MD   40 mg at 08/21/19 2143   feeding supplement (ENSURE ENLIVE) (ENSURE ENLIVE) liquid 237 mL  237 mL Oral TID BM Max Sane, MD   237 mL at 08/22/19 1102   HYDROcodone-acetaminophen (NORCO/VICODIN) 5-325 MG per tablet 1-2 tablet  1-2 tablet Oral Q4H PRN Max Sane, MD   1 tablet at 08/17/19 1659   lactulose (CHRONULAC) 10 GM/15ML solution 30 g  30 g Oral BID Dustin Flock, MD   30 g at 08/22/19 1057   levETIRAcetam (KEPPRA) tablet 1,000 mg  1,000 mg Oral BID Max Sane, MD   1,000 mg at 08/21/19  2141   megestrol (MEGACE) 400 MG/10ML suspension 400 mg  400 mg Oral Daily Dustin Flock, MD       metoprolol succinate (TOPROL-XL) 24 hr tablet 25 mg  25 mg Oral Daily Max Sane, MD   25 mg at 08/22/19 1058   multivitamin with minerals tablet 1 tablet  1 tablet Oral Daily Max Sane, MD   1 tablet at 08/22/19 1058   ondansetron (ZOFRAN) tablet 4 mg  4 mg Oral Q6H PRN Max Sane, MD       Or   ondansetron [facility]) injection 4 mg  4 mg Intravenous Q6H PRN Max Sane, MD       ondansetron (ZOFRAN) tablet 8 mg  8 mg Oral BID Max Sane, MD   8 mg at 08/22/19 1058   pantoprazole (PROTONIX) EC tablet 40 mg  40 mg Oral Daily Max Sane, MD   40 mg at 08/22/19 1058    PARoxetine (PAXIL) tablet 20 mg  20 mg Oral Daily Row[place] Lathe, RPH   20 mg at 08/21/19 1035   polyethylene glycol (MIRALAX / GLYCOLAX) packet 17 g  17 g Oral Daily Dustin Flock, MD   17 g at 08/22/19 1057   sodium chloride flush (NS) 0.9 % injection 10-40 mL  10-40 mL Intracatheter PRN Max Sane, MD       sodium phosphate (FLEET) 7-19 GM/118ML enema 1 enema  1 enema Rectal Daily PRN Dustin Flock, MD       traZODone (DESYREL) tablet 25 mg  25 mg Oral QHS PRN Max Sane, MD   25 mg at 08/19/19 2218    VITAL SIGNS: BP 119/87 (BP Location: Left Arm)    Pulse 74    Temp 98.4 F (36.9 C) (Axillary)    Resp 17    Ht '5\' 4"'$  (1.626 m)    Wt 187 lb 6.3 oz (85 kg)    SpO2 97%    BMI 32.17 kg/m  Filed Weights   08/20/19 0413 08/21/19 0415 08/22/19 0500  Weight: 172 lb 6.4 oz (78.2 kg) 189 lb 2.5 oz (85.8 kg) 187 lb 6.3 oz (85 kg)    Estimated body mass index is 32.17 kg/m as calculated from the following:   Height as of this encounter: '5\' 4"'$  (1.626 m).   Weight as of this encounter: 187 lb 6.3 oz (85 kg).  LABS: CBC:    Component Value Date/Time   WBC 11.6 (H) 08/20/2019 0555   HGB 13.7 08/20/2019 0555   HCT 40.2 08/20/2019 0555   HCT 45.1 11/29/2012 0955   PLT 153 08/20/2019 0555   MCV 89.5 08/20/2019 0555   NEUTROABS 13.7 (H) 08/16/2019 0748   LYMPHSABS 1.4 08/16/2019 0748   MONOABS 1.0 08/16/2019 0748   EOSABS 0.3 08/16/2019 0748   BASOSABS 0.0 08/16/2019 0748   Comprehensive Metabolic Panel:    Component Value Date/Time   NA 138 08/20/2019 0555   K 4.0 08/20/2019 0555   CL 106 08/20/2019 0555   CO2 24 08/20/2019 0555   BUN 16 08/20/2019 0555   CREATININE 0.74 08/21/2019 0512   GLUCOSE 135 (H) 08/20/2019 0555   CALCIUM 8.9 08/20/2019 0555   AST 24 08/16/2019 0748   ALT 39 08/16/2019 0748   ALKPHOS 65 08/16/2019 0748   BILITOT 1.3 (H) 08/16/2019 0748   PROT 6.9 08/16/2019 0748   ALBUMIN 4.1 08/16/2019 0748    RADIOGRAPHIC STUDIES: Ct Head Wo  Contrast  Result Date: 08/16/2019 CLINICAL DATA:  Altered mental status EXAM:  CT HEAD WITHOUT CONTRAST TECHNIQUE: Contiguous axial images were obtained from the base of the skull through the vertex without intravenous contrast. COMPARISON:  06/08/2019 FINDINGS: Brain: Postoperative right temporal lobe. Superficial to the craniectomy flap is a lobulated tense appearing fluid collection in the scalp and upper masticator space, measuring up to 6 x 3.6 cm on axial slices. No acute hemorrhage, hydrocephalus, or intracranial mass effect. Vascular: No hyperdense vessel or unexpected calcification. Skull: Right pterional craniectomy with cranioplasty. Sinuses/Orbits: Negative IMPRESSION: 1. No acute intracranial finding. 2. 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 Electronically Signed   By: Monte Fantasia M.D.   On: 08/16/2019 08:57   [name] JG Contrast  Result Date: 08/18/2019 CLINICAL DATA:  Metastatic melanoma. Postop craniotomy. Altered mental status. EXAM: MRI HEAD WITHOUT AND WITH CONTRAST TECHNIQUE: Multiplanar, multiecho pulse sequences of the brain and surrounding structures were obtained without and with intravenous contrast. CONTRAST:  23m GADAVIST GADOBUTROL 1 MMOL/ML IV SOLN COMPARISON:  CT head 08/16/2019.  MRI head with contrast 06/08/2019 FINDINGS: Brain: Resection of tumor mass in the right temporal lobe since the prior MRI. Surgical encephalomalacia in the right inferior and anterior temporal lobe with irregular enhancement of the post surgical cavity in the right temporal lobe. Fluid extends through the craniotomy defect into the subgaleal tissues where there is a fluid collection measuring approximately 6.2 x 3.0 cm. 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 Significant progression in leptomeningeal enhancement diffusely and bilaterally. This also extends into the posterior fossa. Ventricle size normal.  No  acute infarct.  No midline shift. Vascular: Normal arterial flow voids Skull and upper cervical spine: Right temporal craniotomy with pseudomeningocele as described above. Sinuses/Orbits: Negative Other: None IMPRESSION: Postsurgical resection of large right temporal lobe mass. Surgical fluid collection in the right temporal lobe extends through the craniotomy defect into the subgaleal tissues compatible with pseudomeningocele. Irregular enhancement of the leptomeninges has progressed significantly since 06/08/2019 compatible with leptomeningeal carcinomatosis. Electronically Signed   By: CFranchot GalloM.D.   On: 08/18/2019 17:45   Dg Chest Port 1 View  Result Date: 08/18/2019 CLINICAL DATA:  Somnolence. EXAM: PORTABLE CHEST 1 VIEW COMPARISON:  Radiograph 08/16/2019. PET CT 07/20/2019 FINDINGS: The cardiomediastinal contours are unchanged, upper normal heart size. No pulmonary edema. Slight increase in bibasilar atelectasis from prior. No consolidation, pleural effusion, or pneumothorax. No acute osseous abnormalities are seen. IMPRESSION: Slight increase in bibasilar atelectasis from prior. Otherwise no acute findings. Electronically Signed   By: MKeith RakeM.D.   On: 08/18/2019 22:47   Dg Chest Portable 1 View  Result Date: 08/16/2019 CLINICAL DATA:  Altered mental status EXAM: PORTABLE CHEST 1 VIEW COMPARISON:  PET-CT July 20, 2019 FINDINGS: Lungs are clear. Heart is upper normal in size with pulmonary vascularity normal. No adenopathy. No bone lesions. IMPRESSION: No edema or consolidation. No evident adenopathy. Heart upper normal in size. Electronically Signed   By: WLowella GripIII M.D.   On: 08/16/2019 08:56    PERFORMANCE STATUS (ECOG) : 4 - Bedbound  Review of Systems Unable to complete  Physical Exam General: Ill-appearing Pulmonary: Unlabored Abdomen: soft, nontender, + bowel sounds GU: no suprapubic tenderness Extremities: no edema, no joint deformities Skin: no  rashes Neurological: Lethargic  IMPRESSION: Weekend notes reviewed.  Case discussed with Dr. PPosey Pronto  I met in private with patient's husband.  We discussed patient's probable poor prognosis.  Neurology has expressed that life expectancy may be measured  and weeks.  Husband again asked about hospice involvement and we discussed in detail.  Husband says that he would prefer that patient go home with hospice and focus on comfort and quality of life versus pursuing treatment.  However, he says that patient's mother and father have expressed a desire to seek second opinions.  Husband is hopeful that patient's brother may be able to speak with family and allow for cohesive decision making.  Husband is hopeful that Dr. Grayland Ormond will be able to speak with family to discuss prognosis.  We again discussed CODE STATUS.  Husband says that he would not be interested in resuscitation but is hoping that his in-laws will agree prior to making a final decision.  PLAN: -Continue current scope of treatment -We will continue to follow and work with family on goals  Time Total: 30 minutes  Visit consisted of counseling and education dealing with the complex and emotionally intense issues of symptom management and palliative care in the setting of serious and potentially life-threatening illness.Greater than 50%  of this time was spent counseling and coordinating care related to the above assessment and plan.  Signed by: Altha Harm, PhD, NP-C [phone] (Work Cell)

## 2019-08-22 NOTE — Discharge Summary (Signed)
[name] at [facility], 53 y.o., DOB May 08, 1966, MRN [id number]. 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  Patient was also noticed to have fever at home.  Initially there was some concern that she may have encephalitis or meningitis.  She was placed on antibiotics.  MRI of the brain showed worsening enhancement due to leptomeningeal malignant melanoma.  She was seen by neurology ID and oncology.  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  Her prognosis is very poor.  According to neurology patient has few weeks to 2 to 3 months to live.  Husband is in understanding of this.  She was seen by palliative care team.  She is full code.  However he is agreeable to take her home with hospice.  Further discussions with her family will be helpful to decide on CODE STATUS.           Consults  neuro, id, oncology  Significant Tests:  See full reports for all details     Ct Head Wo Contrast  Result Date: 08/16/2019 CLINICAL DATA:  Altered mental status EXAM: CT HEAD WITHOUT CONTRAST TECHNIQUE: Contiguous axial images were obtained from the base of the skull through the vertex without intravenous contrast. COMPARISON:  06/08/2019 FINDINGS: Brain: Postoperative right temporal lobe. Superficial to the craniectomy flap is a  lobulated tense appearing fluid collection in the scalp and upper masticator space, measuring up to 6 x 3.6 cm on axial slices. No acute hemorrhage, hydrocephalus, or intracranial mass effect. Vascular: No hyperdense vessel or unexpected calcification. Skull: Right pterional craniectomy with cranioplasty. Sinuses/Orbits: Negative IMPRESSION: 1. No acute intracranial finding. 2. 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 Electronically Signed   By: Monte Fantasia M.D.   On: 08/16/2019 08:57   [name] DX Contrast  Result Date: 08/18/2019 CLINICAL DATA:  Metastatic melanoma. Postop craniotomy. Altered mental status. EXAM: MRI HEAD WITHOUT AND WITH CONTRAST TECHNIQUE: Multiplanar, multiecho pulse sequences of the brain and surrounding structures were obtained without and with intravenous contrast. CONTRAST:  83m GADAVIST GADOBUTROL 1 MMOL/ML IV SOLN COMPARISON:  CT head 08/16/2019.  MRI head with contrast 06/08/2019 FINDINGS: Brain: Resection of tumor mass in the right temporal lobe since the prior MRI. Surgical encephalomalacia in the right inferior and anterior temporal lobe with irregular enhancement of the post surgical cavity in the right temporal lobe. Fluid extends through the craniotomy defect into the subgaleal tissues where there is a fluid collection measuring approximately 6.2 x 3.0 cm. 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 Significant progression in leptomeningeal enhancement diffusely and bilaterally. This also extends into the posterior fossa. Ventricle size normal.  No acute infarct.  No midline shift. Vascular: Normal arterial flow voids Skull and upper cervical spine: Right temporal craniotomy with pseudomeningocele as described above. Sinuses/Orbits: Negative Other: None IMPRESSION: Postsurgical resection of large right temporal lobe mass. Surgical fluid collection in the right  temporal lobe extends through  the craniotomy defect into the subgaleal tissues compatible with pseudomeningocele. Irregular enhancement of the leptomeninges has progressed significantly since 06/08/2019 compatible with leptomeningeal carcinomatosis. Electronically Signed   By: Franchot Gallo M.D.   On: 08/18/2019 17:45   Dg Chest Port 1 View  Result Date: 08/18/2019 CLINICAL DATA:  Somnolence. EXAM: PORTABLE CHEST 1 VIEW COMPARISON:  Radiograph 08/16/2019. PET CT 07/20/2019 FINDINGS: The cardiomediastinal contours are unchanged, upper normal heart size. No pulmonary edema. Slight increase in bibasilar atelectasis from prior. No consolidation, pleural effusion, or pneumothorax. No acute osseous abnormalities are seen. IMPRESSION: Slight increase in bibasilar atelectasis from prior. Otherwise no acute findings. Electronically Signed   By: Keith Rake M.D.   On: 08/18/2019 22:47   Dg Chest Portable 1 View  Result Date: 08/16/2019 CLINICAL DATA:  Altered mental status EXAM: PORTABLE CHEST 1 VIEW COMPARISON:  PET-CT July 20, 2019 FINDINGS: Lungs are clear. Heart is upper normal in size with pulmonary vascularity normal. No adenopathy. No bone lesions. IMPRESSION: No edema or consolidation. No evident adenopathy. Heart upper normal in size. Electronically Signed   By: Lowella Grip III M.D.   On: 08/16/2019 08:56       Today   Subjective:   Vickie Waters she currently denying any symptom  Objective:   Blood pressure 119/87, pulse 74, temperature 98.4 F (36.9 C), temperature source Axillary, resp. rate 17, height '5\' 4"'$  (1.626 m), weight 85 kg, SpO2 97 %.  .  Intake/Output Summary (Last 24 hours) at 08/22/2019 1335 Last data filed at 08/21/2019 1851 Gross per 24 hour  Intake 240 ml  Output 1175 ml  Net -935 ml    Exam VITAL SIGNS: Blood pressure 119/87, pulse 74, temperature 98.4 F (36.9 C), temperature source Axillary, resp. rate 17, height '5\' 4"'$  (1.626 m), weight 85 kg, SpO2 97 %.  GENERAL:  53  y.o.-year-old patient lying in the bed with no acute distress.  EYES: Pupils equal, round, reactive to light and accommodation. No scleral icterus. Extraocular muscles intact.  HEENT: Head atraumatic, normocephalic. Oropharynx and nasopharynx clear.  NECK:  Supple, no jugular venous distention. No thyroid enlargement, no tenderness.  LUNGS: Normal breath sounds bilaterally, no wheezing, rales,rhonchi or crepitation. No use of accessory muscles of respiration.  CARDIOVASCULAR: S1, S2 normal. No murmurs, rubs, or gallops.  ABDOMEN: Soft, nontender, nondistended. Bowel sounds present. No organomegaly or mass.  EXTREMITIES: No pedal edema, cyanosis, or clubbing.  NEUROLOGIC: Cranial nerves II through XII are intact. Muscle strength 5/5 in all extremities. Sensation intact. Gait not checked.  PSYCHIATRIC: The patient is alert and oriented x 3.  SKIN: No obvious rash, lesion, or ulcer.   Data Review     CBC w Diff:  Lab Results  Component Value Date   WBC 11.6 (H) 08/20/2019   HGB 13.7 08/20/2019   HCT 40.2 08/20/2019   HCT 45.1 11/29/2012   PLT 153 08/20/2019   LYMPHOPCT 8 08/16/2019   MONOPCT 6 08/16/2019   EOSPCT 2 08/16/2019   BASOPCT 0 08/16/2019   CMP:  Lab Results  Component Value Date   NA 138 08/20/2019   K 4.0 08/20/2019   CL 106 08/20/2019   CO2 24 08/20/2019   BUN 16 08/20/2019   CREATININE 0.74 08/21/2019   PROT 6.9 08/16/2019   ALBUMIN 4.1 08/16/2019   BILITOT 1.3 (H) 08/16/2019   ALKPHOS 65 08/16/2019   AST 24 08/16/2019   ALT 39 08/16/2019  .  Micro Results Recent Results (  from the past 240 hour(s))  SARS CORONAVIRUS 2 (TAT 6-24 HRS) Nasopharyngeal Nasopharyngeal Swab     Status: None   Collection Time: 08/16/19  7:48 AM   Specimen: Nasopharyngeal Swab  Result Value Ref Range Status   SARS Coronavirus 2 NEGATIVE NEGATIVE Final    Comment: (NOTE) SARS-CoV-2 target nucleic acids are NOT DETECTED. The SARS-CoV-2 RNA is generally detectable in upper and  lower respiratory specimens during the acute phase of infection. Negative results do not preclude SARS-CoV-2 infection, do not rule out co-infections with other pathogens, and should not be used as the sole basis for treatment or other patient management decisions. Negative results must be combined with clinical observations, patient history, and epidemiological information. The expected result is Negative. Fact Sheet for Patients: SugarRoll.be Fact Sheet for Healthcare Providers: https://www.woods-mathews.com/ This test is not yet approved or cleared by the [place] FDA and  has been authorized for detection and/or diagnosis of SARS-CoV-2 by FDA under an Emergency Use Authorization (EUA). This EUA will remain  in effect (meaning this test can be used) for the duration of the COVID-19 declaration under Section 56 4(b)(1) of the Act, 21 U.S.C. section 360bbb-3(b)(1), unless the authorization is terminated or revoked sooner. Performed at [facility], [place] [address], [place], [place] [address]   Blood Culture (routine x 2)     Status: Abnormal   Collection Time: 08/16/19  7:48 AM   Specimen: BLOOD  Result Value Ref Range Status   Specimen Description   Final    BLOOD LEFT ANTECUBITAL Performed at [facility], [address] [place], [place] [address]    Special Requests   Final    BOTTLES DRAWN AEROBIC AND ANAEROBIC Blood Culture results may not be optimal due to an excessive volume of blood received in culture bottles Performed at [facility], [address], [place], [place] [address]    Culture  Setup Time   Final    GRAM POSITIVE COCCI AEROBIC BOTTLE ONLY CRITICAL RESULT CALLED TO, READ BACK BY AND VERIFIED WITH: SCOTT HALL AT [place] ON 08/17/2019 [place].    Culture (A)  Final    STAPHYLOCOCCUS SPECIES (COAGULASE NEGATIVE) THE SIGNIFICANCE OF ISOLATING THIS ORGANISM FROM A SINGLE SET OF BLOOD CULTURES  WHEN MULTIPLE SETS ARE DRAWN IS UNCERTAIN. PLEASE NOTIFY THE MICROBIOLOGY DEPARTMENT WITHIN ONE WEEK IF SPECIATION AND SENSITIVITIES ARE REQUIRED. Performed at [facility], [place] [address] [place], [place] [address]    Report Status 08/18/2019 FINAL  Final  Blood Culture ID Panel (Reflexed)     Status: Abnormal   Collection Time: 08/16/19  7:48 AM  Result Value Ref Range Status   Enterococcus species NOT DETECTED NOT DETECTED Final   Listeria monocytogenes NOT DETECTED NOT DETECTED Final   Staphylococcus species DETECTED (A) NOT DETECTED Final    Comment: Methicillin (oxacillin) resistant coagulase negative staphylococcus. Possible blood culture contaminant (unless isolated from more than one blood culture draw or clinical case suggests pathogenicity). No antibiotic treatment is indicated for blood  culture contaminants. CRITICAL RESULT CALLED TO, READ BACK BY AND VERIFIED WITH: SCOTT HALL AT 0701 ON 08/17/2019 [place].    Staphylococcus aureus (BCID) NOT DETECTED NOT DETECTED Final   Methicillin resistance DETECTED (A) NOT DETECTED Final    Comment: CRITICAL RESULT CALLED TO, READ BACK BY AND VERIFIED WITH: SCOTT HALL AT 0701 ON 08/17/2019 [place].    Streptococcus species NOT DETECTED NOT DETECTED Final   Streptococcus agalactiae NOT DETECTED NOT DETECTED Final  Streptococcus pneumoniae NOT DETECTED NOT DETECTED Final   Streptococcus pyogenes NOT DETECTED NOT DETECTED Final   Acinetobacter baumannii NOT DETECTED NOT DETECTED Final   Enterobacteriaceae species NOT DETECTED NOT DETECTED Final   Enterobacter cloacae complex NOT DETECTED NOT DETECTED Final   Escherichia coli NOT DETECTED NOT DETECTED Final   Klebsiella oxytoca NOT DETECTED NOT DETECTED Final   Klebsiella pneumoniae NOT DETECTED NOT DETECTED Final   Proteus species NOT DETECTED NOT DETECTED Final   Serratia marcescens NOT DETECTED NOT DETECTED Final   Haemophilus influenzae NOT DETECTED NOT DETECTED Final   Neisseria  meningitidis NOT DETECTED NOT DETECTED Final   Pseudomonas aeruginosa NOT DETECTED NOT DETECTED Final   Candida albicans NOT DETECTED NOT DETECTED Final   Candida glabrata NOT DETECTED NOT DETECTED Final   Candida krusei NOT DETECTED NOT DETECTED Final   Candida parapsilosis NOT DETECTED NOT DETECTED Final   Candida tropicalis NOT DETECTED NOT DETECTED Final    Comment: Performed at [facility], [address] [place], [place] [address]  Blood Culture (routine x 2)     Status: None   Collection Time: 08/16/19  7:49 AM   Specimen: BLOOD  Result Value Ref Range Status   Specimen Description BLOOD LEFT HAND  Final   Special Requests   Final    BOTTLES DRAWN AEROBIC AND ANAEROBIC Blood Culture adequate volume   Culture   Final    NO GROWTH 5 DAYS Performed at [facility], [address] [place], [place] [address]    Report Status 08/21/2019 FINAL  Final  CULTURE, BLOOD (ROUTINE X 2) w Reflex to ID Panel     Status: None (Preliminary result)   Collection Time: 08/19/19  9:00 AM   Specimen: BLOOD  Result Value Ref Range Status   Specimen Description BLOOD BLOOD RIGHT HAND  Final   Special Requests   Final    BOTTLES DRAWN AEROBIC AND ANAEROBIC Blood Culture adequate volume   Culture   Final    NO GROWTH 3 DAYS Performed at [facility], [address] [place], [place] [address]    Report Status PENDING  Incomplete  CULTURE, BLOOD (ROUTINE X 2) w Reflex to ID Panel     Status: None (Preliminary result)   Collection Time: 08/19/19  9:47 AM   Specimen: BLOOD  Result Value Ref Range Status   Specimen Description BLOOD BLOOD RIGHT HAND  Final   Special Requests   Final    BOTTLES DRAWN AEROBIC AND ANAEROBIC Blood Culture adequate volume   Culture   Final    NO GROWTH 3 DAYS Performed at [facility], [address] [place], [place] [address]    Report Status PENDING  Incomplete        Code Status Orders  (From admission, onward)          Start     Ordered   08/16/19 1233  Full code  Continuous     08/16/19 1232        Code Status History    This patient has a current code status but no historical code status.   Advance Care Planning Activity            Discharge Medications   Allergies as of 08/22/2019      Reactions   Meropenem Hives   Within 3 hours of adminstration   Cephalosporins Hives   Penicillins Hives      Medication List    STOP taking these  medications   omeprazole 40 MG capsule Commonly known as: PRILOSEC     TAKE these medications   amLODipine 5 MG tablet Commonly known as: NORVASC Take 5 mg by mouth daily.   dexamethasone 4 MG tablet Commonly known as: DECADRON Take 1 tablet (4 mg total) by mouth 3 (three) times daily. What changed: when to take this   docusate sodium 100 MG capsule Commonly known as: COLACE Take 1 capsule (100 mg total) by mouth 2 (two) times daily.   feeding supplement (ENSURE ENLIVE) Liqd Take 237 mLs by mouth 3 (three) times daily between meals.   glucose blood test strip Commonly known as: ONE TOUCH ULTRA TEST USE ONE STRIP TO CHECK GLUCOSE TWICE DAILY   lansoprazole 30 MG capsule Commonly known as: PREVACID Take 1 capsule (30 mg total) by mouth daily at 12 noon.   levETIRAcetam 1000 MG tablet Commonly known as: KEPPRA Take 1,000 mg by mouth 2 (two) times daily.   metoprolol succinate 25 MG 24 hr tablet Commonly known as: TOPROL-XL Take 1 tablet by mouth once daily   ondansetron 8 MG tablet Commonly known as: ZOFRAN Take 1 tablet (8 mg total) by mouth 2 (two) times daily.   ONE TOUCH DELICA LANCING DEV Misc Use as instructed to check blood sugar 2 times daily   ONE TOUCH ULTRA MINI w/Device Kit Use as instructed to test blood sugar 2 times daily   OneTouch Delica Lancets Fine Misc Check blood sugar twice a day and as instructed. Dx E11.9   PARoxetine 20 MG tablet Commonly known as: PAXIL TAKE 1 TABLET BY MOUTH ONCE DAILY FOR  ANXIETY AND FOR DEPRESSION What changed:   how much to take  how to take this  when to take this  additional instructions   polyethylene glycol 17 g packet Commonly known as: MIRALAX / GLYCOLAX Take 17 g by mouth daily. Start taking on: August 23, 2019          Total Time in preparing paper work, data evaluation and todays exam - 73 minutes  Dustin Flock M.D on 08/22/2019 at [place]  [phone]

## 2019-08-22 NOTE — Progress Notes (Signed)
Patient's husband asking about home medications that he can to EMS when they picked up patient.  There are no medications documented by staff as coming with patient.  Spoke with Caryl Pina, RN that admitted patient and she stated there were no medications brought from the ED.  Pharmacy does not have any medications in storage for patient, nor does the ED per Marya Amsler, RN.  Made unit Director aware and Dorena Cookey will speak with patient's husband Lavonna Monarch, BSN

## 2019-08-22 NOTE — Progress Notes (Signed)
Hematology/Oncology Progress Note [facility] Telephone:([phone] [phone]  Patient Care Team: Pleas Koch, NP as PCP - General (Nurse Practitioner)   Name of the patient: Vickie Waters  [id number]  Aug 30, 1966  Date of visit: 08/22/19   INTERVAL HISTORY-  Patient lathargic. Husband is at bedside.  Per husband, patient was awake for about half an hour for physical therapy evaluation this morning. Maybe exhausted due to that and is taking a nap.     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[place]) 07/15/2019   Goals of care, counseling/discussion 07/15/2019   Altered taste 05/09/2019   Murmur, cardiac 03/04/2016   Preventative health care 03/04/2016   Type 2 diabetes mellitus ([place]) 03/04/2016   Vitamin D deficiency 03/04/2016   Migraine with aura and with status migrainosus, not intractable 11/15/2015   Essential hypertension 08/23/2015   Anxiety and depression 07/12/2015   Tobacco abuse 07/12/2015     Past Medical History:  Diagnosis Date   Allergy    Asthma    Cancer ([place])    Depression    Frequent headaches    Hypertension    Migraine      Past Surgical History:  Procedure Laterality Date   CHOLECYSTECTOMY  2001    Social History   Socioeconomic History   Marital status: Married    Spouse name: Not on file   Number of children: Not on file   Years of education: Not on file   Highest education level: Not on file  Occupational History    Not on file  Social Needs   Financial resource strain: Not hard at all   Food insecurity    Worry: Never true    Inability: Never true   Transportation needs    Medical: No    Non-medical: No  Tobacco Use   Smoking status: Current Every Day Smoker    Packs/day: 1.00    Types: Cigarettes   Smokeless tobacco: Never Used  Substance and Sexual Activity   Alcohol use: Yes    Alcohol/week: 0.0 standard drinks    Comment: social   Drug use: Never   Sexual activity: Not on file  Lifestyle   Physical activity    Days per week: Patient refused    Minutes per session: Patient refused   Stress: Not on file  Relationships   Social connections    Talks on phone: Patient refused    Gets together: Patient refused    Attends religious service: Patient refused    Active member of club or organization: Patient refused    Attends meetings of clubs or organizations: Patient refused    Relationship status: Patient refused   Intimate partner violence    Fear of current or ex partner: No    Emotionally abused: No    Physically abused: No    Forced sexual activity: No  Other Topics Concern   Not on file  Social History Narrative   Married.   2 children  Works as an Scientist, water quality.   Enjoys spending time with her family.     Family History  Problem Relation Age of Onset   Arthritis Mother    Hypertension Mother    Hypertension Father    Diabetes Father    Mental illness Maternal Grandfather    Breast cancer Neg Hx      Current Facility-Administered Medications:    acetaminophen (TYLENOL) tablet 650 mg, 650 mg, Oral, Q6H PRN, 650 mg at 08/19/19 1600 **OR** acetaminophen (TYLENOL) suppository 650 mg, 650 mg, Rectal, Q6H PRN, Max Sane, MD, 650 mg at 08/18/19 2159   amLODipine (NORVASC) tablet 5 mg, 5 mg, Oral, Daily, Manuella Ghazi, Vipul, MD, 5 mg at 08/22/19 1058   bisacodyl (DULCOLAX) EC tablet 5 mg, 5 mg, Oral, Daily PRN, Max Sane, MD, 5 mg at 08/21/19  1212   dexamethasone (DECADRON) injection 4 mg, 4 mg, Intravenous, Q8H, Manuella Ghazi, Vipul, MD, 4 mg at 08/22/19 0537   docusate sodium (COLACE) capsule 100 mg, 100 mg, Oral, BID, Manuella Ghazi, Vipul, MD, 100 mg at 08/22/19 1058   enoxaparin (LOVENOX) injection 40 mg, 40 mg, Subcutaneous, Q24H, Manuella Ghazi, Vipul, MD, 40 mg at 08/21/19 2143   feeding supplement (ENSURE ENLIVE) (ENSURE ENLIVE) liquid 237 mL, 237 mL, Oral, TID BM, Manuella Ghazi, Vipul, MD, 237 mL at 08/22/19 1102   HYDROcodone-acetaminophen (NORCO/VICODIN) 5-325 MG per tablet 1-2 tablet, 1-2 tablet, Oral, Q4H PRN, Max Sane, MD, 1 tablet at 08/17/19 1659   lactulose (CHRONULAC) 10 GM/15ML solution 30 g, 30 g, Oral, BID, Dustin Flock, MD, 30 g at 08/22/19 1057   levETIRAcetam (KEPPRA) tablet 1,000 mg, 1,000 mg, Oral, BID, Max Sane, MD, 1,000 mg at 08/22/19 1107   megestrol (MEGACE) 400 MG/10ML suspension 400 mg, 400 mg, Oral, Daily, Dustin Flock, MD, 400 mg at 08/22/19 1107   metoprolol succinate (TOPROL-XL) 24 hr tablet 25 mg, 25 mg, Oral, Daily, Manuella Ghazi, Vipul, MD, 25 mg at 08/22/19 1058   multivitamin with minerals tablet 1 tablet, 1 tablet, Oral, Daily, Max Sane, MD, 1 tablet at 08/22/19 1058   ondansetron (ZOFRAN) tablet 4 mg, 4 mg, Oral, Q6H PRN **OR** ondansetron (ZOFRAN) injection 4 mg, 4 mg, Intravenous, Q6H PRN, Max Sane, MD   ondansetron (ZOFRAN) tablet 8 mg, 8 mg, Oral, BID, Manuella Ghazi, Vipul, MD, 8 mg at 08/22/19 1058   pantoprazole (PROTONIX) EC tablet 40 mg, 40 mg, Oral, Daily, Manuella Ghazi, Vipul, MD, 40 mg at 08/22/19 1058   PARoxetine (PAXIL) tablet 20 mg, 20 mg, Oral, Daily, Rowland Lathe, RPH, 20 mg at 08/22/19 1107   polyethylene glycol (MIRALAX / GLYCOLAX) packet 17 g, 17 g, Oral, Daily, Dustin Flock, MD, 17 g at 08/22/19 1057   sodium chloride flush (NS) 0.9 % injection 10-40 mL, 10-40 mL, Intracatheter, PRN, Max Sane, MD   sodium phosphate (FLEET) 7-19 GM/118ML enema 1 enema, 1 enema, Rectal, Daily PRN, Dustin Flock, MD   traZODone (DESYREL) tablet 25 mg, 25 mg, Oral, QHS PRN, Max Sane, MD, 25 mg at 08/19/19 2218   Physical exam:  Vitals:   08/21/19 1950 08/22/19 0447 08/22/19 0500 08/22/19 1056  BP: 129/90 (!) 148/98  119/87  Pulse: 76 73  74  Resp:  17    Temp: 99.5 F (37.5 C) 98.4 F (36.9 C)    TempSrc: Oral Axillary    SpO2: 95% 97%    Weight:   187 lb 6.3 oz (85 kg)   Height:       Physical Exam  Constitutional: No distress.  HENT:  Mouth/Throat: No oropharyngeal exudate.  Right temple soft tissue swelling  Eyes: No scleral icterus.  Neck: Neck supple.  Cardiovascular: Normal rate.  Pulmonary/Chest: Effort normal. No respiratory distress.  Abdominal: Soft. Bowel sounds are normal.  Musculoskeletal: Normal range of motion.        General: No edema.  Neurological:  Lethargic.only responded to simple questions.   Skin: Skin is warm and dry.  Psychiatric: Affect normal.       CMP Latest Ref Rng & Units 08/21/2019  Glucose 70 - 99 mg/dL -  BUN 6 - 20 mg/dL -  Creatinine 0.44 - 1.00 mg/dL 0.74  Sodium 135 - 145 mmol/L -  Potassium 3.5 - 5.1 mmol/L -  Chloride 98 - 111 mmol/L -  CO2 22 - 32 mmol/L -  Calcium 8.9 - 10.3 mg/dL -  Total Protein 6.5 - 8.1 g/dL -  Total Bilirubin 0.3 - 1.2 mg/dL -  Alkaline Phos 38 - 126 U/L -  AST 15 - 41 U/L -  ALT 0 - 44 U/L -   CBC Latest Ref Rng & Units 08/20/2019  WBC 4.0 - 10.5 K/uL 11.6(H)  Hemoglobin 12.0 - 15.0 g/dL 13.7  Hematocrit 36.0 - 46.0 % 40.2  Platelets 150 - 400 K/uL 153    RADIOGRAPHIC STUDIES: I have personally reviewed the radiological images as listed and agreed with the findings in the report.   Ct Head Wo Contrast  Result Date: 08/16/2019 CLINICAL DATA:  Altered mental status EXAM: CT HEAD WITHOUT CONTRAST TECHNIQUE: Contiguous axial images were obtained from the base of the skull through the vertex without intravenous contrast. COMPARISON:  06/08/2019 FINDINGS: Brain: Postoperative right temporal  lobe. Superficial to the craniectomy flap is a lobulated tense appearing fluid collection in the scalp and upper masticator space, measuring up to 6 x 3.6 cm on axial slices. No acute hemorrhage, hydrocephalus, or intracranial mass effect. Vascular: No hyperdense vessel or unexpected calcification. Skull: Right pterional craniectomy with cranioplasty. Sinuses/Orbits: Negative IMPRESSION: 1. No acute intracranial finding. 2. 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 Electronically Signed   By: Monte Fantasia M.D.   On: 08/16/2019 08:57   [name] [id number] Contrast  Result Date: 08/18/2019 CLINICAL DATA:  Metastatic melanoma. Postop craniotomy. Altered mental status. EXAM: MRI HEAD WITHOUT AND WITH CONTRAST TECHNIQUE: Multiplanar, multiecho pulse sequences of the brain and surrounding structures were obtained without and with intravenous contrast. CONTRAST:  62mL GADAVIST GADOBUTROL 1 MMOL/ML IV SOLN COMPARISON:  CT head 08/16/2019.  MRI head with contrast 06/08/2019 FINDINGS: Brain: Resection of tumor mass in the right temporal lobe since the prior MRI. Surgical encephalomalacia in the right inferior and anterior temporal lobe with irregular enhancement of the post surgical cavity in the right temporal lobe. Fluid extends through the craniotomy defect into the subgaleal tissues where there is a fluid collection measuring approximately 6.2 x 3.0 cm. 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 Significant progression in leptomeningeal enhancement diffusely and bilaterally. This also extends into the posterior fossa. Ventricle size normal.  No acute infarct.  No midline shift. Vascular: Normal arterial flow voids Skull and upper cervical spine: Right temporal craniotomy with pseudomeningocele as described above. Sinuses/Orbits: Negative Other: None IMPRESSION: Postsurgical resection of large right temporal lobe mass. Surgical fluid  collection in the right temporal lobe extends through the craniotomy defect into the subgaleal tissues compatible with pseudomeningocele. Irregular enhancement of the leptomeninges has progressed significantly since 06/08/2019 compatible  with leptomeningeal carcinomatosis. Electronically Signed   By: Franchot Gallo M.D.   On: 08/18/2019 17:45   Dg Chest Port 1 View  Result Date: 08/18/2019 CLINICAL DATA:  Somnolence. EXAM: PORTABLE CHEST 1 VIEW COMPARISON:  Radiograph 08/16/2019. PET CT 07/20/2019 FINDINGS: The cardiomediastinal contours are unchanged, upper normal heart size. No pulmonary edema. Slight increase in bibasilar atelectasis from prior. No consolidation, pleural effusion, or pneumothorax. No acute osseous abnormalities are seen. IMPRESSION: Slight increase in bibasilar atelectasis from prior. Otherwise no acute findings. Electronically Signed   By: Keith Rake M.D.   On: 08/18/2019 22:47   Dg Chest Portable 1 View  Result Date: 08/16/2019 CLINICAL DATA:  Altered mental status EXAM: PORTABLE CHEST 1 VIEW COMPARISON:  PET-CT July 20, 2019 FINDINGS: Lungs are clear. Heart is upper normal in size with pulmonary vascularity normal. No adenopathy. No bone lesions. IMPRESSION: No edema or consolidation. No evident adenopathy. Heart upper normal in size. Electronically Signed   By: Lowella Grip III M.D.   On: 08/16/2019 08:56    Assessment and plan-  Patient is a 53 y.o. female with history of stage IV melanoma with brain involvement currently under going radiation presented with acute change of mental status, nausea vomiting.  #Stage IV melanoma with brain involvement, status post open brain biopsy currently on radiation. #Acute change of mental status Worsened over weekend, lethargic now.  I had a lenghthy discussion with husband at bedside. Prognosis is extremely poor with leptomeningeal involvement.  Discussed about immunotherapy outpatient with Dr.Finnegan is patient desires  and mental status is stable.  Husband prefers patient to go home with hospice and focus on quality of life vs pursuing more treatments. Patient's parents desires more treatment.  I was under the impression that patient was napping after her physical therapy evaluation. So I recommend family members to discuss with patient to see what she wants and if she desires treatment, she can follow up with Dr.Finnegan.  I discussed with Dr.Patel who informed me that patient's mental status has been lethargic since yesterday. This seems to be a decline from her mental status when I saw her 2 days ago. I agree that prognosis is extremely poor and given that she is declining clinically, I think hospice is appropriate.  Updated Palliative care and Dr.Patel.   Thank you for allowing me to participate in the care of this patient.   Earlie Server, MD, PhD Hematology Oncology [facility] at [facility] Pager- [phone] 08/22/2019

## 2019-08-22 NOTE — Telephone Encounter (Signed)
Yes

## 2019-08-22 NOTE — Telephone Encounter (Signed)
Hospice called reporting patient discharging from hospital with hospice referral and asking if Dr Grayland Ormond will sign orders and serve as attending. Please advise

## 2019-08-22 NOTE — Progress Notes (Signed)
Physical Therapy Treatment Patient Details Name: Vickie Waters MRN: [id number] DOB: 1966-01-09 Today's Date: 08/22/2019    History of Present Illness Per MD H&P: Pt is a 53 y.o. female with a known history of metastatic melanoma is being admitted for nausea, vomiting and altered mental status. She was brought in by [facility] from home, complaints of N/V x 2 days.  Per husband, patient woke up with altered mental status this morning, alert to self only upon arrival.  Pt w/ brain cancer; missed last radiation treatment due to being sick.  She reports not been feeling well last few days.  She knows the year, not quite sure what month. Sleeping a lot last few days.  MD assessment includes: Acute metabolic encephalopathy, N&V, HTN, Metastatic melanoma to right temporal brain -status post open brain biopsy at [facility] on June 30, 2019.    PT Comments    Pt presented with min deficits in transfers, gait, and balance but overall performed well during the session.  Pt was Ind with bed mobility tasks without the use of the rail.  Pt demonstrated good eccentric and concentric control with transfers from various height surfaces including from standard height toilet without use of the grab bar.  Pt was able to amb 200' with slow cadence with short B step length and min drifting left/right but no LOB including during dynamic gait with head turns and start/stops.  Pt's SpO2 and HR WNL during the session with no adverse symptoms noted by the patient.  Pt will benefit from HHPT services upon discharge to safely address above deficits for decreased caregiver assistance and to assist with pt/family education, training, and DME recommendations as needed as the patient's condition evolves.    Follow Up Recommendations  Home health PT;Other (comment)(Pt/spouse wish to continue with HHPT pt was receiving prior to this admission)     Equipment Recommendations  None recommended by PT    Recommendations for  Other Services       Precautions / Restrictions Precautions Precautions: Fall Restrictions Weight Bearing Restrictions: No    Mobility  Bed Mobility Overal bed mobility: Independent                Transfers Overall transfer level: Needs assistance Equipment used: None Transfers: Sit to/from Stand Sit to Stand: Supervision         General transfer comment: Good eccentric and concentric control with transfers from various height surfaces including from standard height toilet without use of the grab bar  Ambulation/Gait Ambulation/Gait assistance: Supervision Gait Distance (Feet): 200 Feet Assistive device: None Gait Pattern/deviations: Step-through pattern;Drifts right/left;Decreased step length - right;Decreased step length - left Gait velocity: decreased   General Gait Details: Slow cadence with short B step length and min drifting left/right but no LOB during dynamic gait with head turns and start/stops   Stairs             Wheelchair Mobility    Modified Rankin (Stroke Patients Only)       Balance Overall balance assessment: Needs assistance   Sitting balance-Leahy Scale: Good     Standing balance support: No upper extremity supported Standing balance-Leahy Scale: Good                              Cognition Arousal/Alertness: Awake/alert Behavior During Therapy: WFL for tasks assessed/performed Overall Cognitive Status: Within Functional Limits for tasks assessed  General Comments: Pt required occasional min veral cues to follow commands correctly      Exercises Other Exercises Other Exercises: Static standing balance training with various foot positions and combinations of eyes open/closed and head still/head turns    General Comments        Pertinent Vitals/Pain Pain Assessment: No/denies pain    Home Living                      Prior Function             PT Goals (current goals can now be found in the care plan section) Progress towards PT goals: Progressing toward goals    Frequency    Min 2X/week      PT Plan Current plan remains appropriate    Co-evaluation              AM-PAC PT "6 Clicks" Mobility   Outcome Measure  Help needed turning from your back to your side while in a flat bed without using bedrails?: None Help needed moving from lying on your back to sitting on the side of a flat bed without using bedrails?: None Help needed moving to and from a bed to a chair (including a wheelchair)?: A Little Help needed standing up from a chair using your arms (e.g., wheelchair or bedside chair)?: A Little Help needed to walk in hospital room?: A Little Help needed climbing 3-5 steps with a railing? : A Little 6 Click Score: 20    End of Session Equipment Utilized During Treatment: Gait belt Activity Tolerance: Patient tolerated treatment well Patient left: in bed;with bed alarm set;with call bell/phone within reach;with family/visitor present Nurse Communication: Mobility status PT Visit Diagnosis: Unsteadiness on feet (R26.81);Difficulty in walking, not elsewhere classified (R26.2);Muscle weakness (generalized) (M62.81)     Time: [id number] PT Time Calculation (min) (ACUTE ONLY): 16 min  Charges:  $Gait Training: 8-22 mins                     D. Scott Coy Vandoren PT, DPT 08/22/19, 12:12 PM

## 2019-08-22 NOTE — Progress Notes (Signed)
New referral for [facility] hospice services at home received from [facility]. Writer met in the room to initiated eduction regarding hospice services, philosophy and team approach to care with understanding voiced. Mr. Bridgeforth has requested a  hospital bed and [facility], plan is for discharge home this afternoon and DME is to be delivered on 10/6 per family request. Hospice contact number given to Mr. Faw. Patient information faxed to referral. Hospital care team updated. Flo Shanks BSN, RN, [facility] [phone]

## 2019-08-22 NOTE — Progress Notes (Signed)
ID leptomeningeal carcinomatosis Metastatic melanoma brain s/p resection with formation of pseudomeningocele Admitted with altere mental status - back to baseline once steroids increased to Q8. Infection was initially a concern because of fever but  patient back to baseline even before antibiotics were started made it less likely. No LP done. Off all antibiotics- no fever . Pt is being discharged today on home hospice.  ID will sgn off-

## 2019-08-22 NOTE — TOC Transition Note (Signed)
Transition of Care [facility]) - CM/SW Discharge Note   Patient Details  Name: Vickie Waters MRN: [id number] Date of Birth: [birth year]  Transition of Care [facility]) CM/SW Contact:  Shelbie Hutching, RN Phone Number: 08/22/2019, 2:54 PM   Clinical Narrative:    Patient is medically cleared for discharge.  Patient will discharge home with her husband and her children.  Patient has a poor prognosis and husband has decided on hospice care at home.  Husband would like to use [facility] for hospice services- referral given to Flo Shanks.  Husband requests hospital bed- he understands that the bed will not be delivered today, he still wants to take patient home today.  Patient's parents will provide transportation for her and her husband home at discharge this afternoon.     Final next level of care: Home w Hospice Care Barriers to Discharge: Barriers Resolved   Patient Goals and CMS Choice Patient states their goals for this hospitalization and ongoing recovery are:: Husband is very overwhelmed and does not know what to do next.  Husband wants all the help and guidance he can get. CMS Medicare.gov Compare Post Acute Care list provided to:: Patient Choice offered to / list presented to : Patient  Discharge Placement                       Discharge Plan and Services   Discharge Planning Services: CM Consult Post Acute Care Choice: Hospice            DME Agency: ([facility]) Date DME Agency Contacted: 08/22/19       [place] Agency: [facility]     Representative spoke with at [place]: Flo Shanks with [facility] Determinants of Health (SDOH) Interventions     Readmission Risk Interventions No flowsheet data found.

## 2019-08-23 ENCOUNTER — Inpatient Hospital Stay: Payer: Managed Care, Other (non HMO) | Admitting: Nurse Practitioner

## 2019-08-23 ENCOUNTER — Other Ambulatory Visit: Payer: Self-pay | Admitting: Primary Care

## 2019-08-23 ENCOUNTER — Inpatient Hospital Stay: Payer: Managed Care, Other (non HMO)

## 2019-08-23 DIAGNOSIS — F32A Depression, unspecified: Secondary | ICD-10-CM

## 2019-08-23 DIAGNOSIS — I10 Essential (primary) hypertension: Secondary | ICD-10-CM

## 2019-08-23 DIAGNOSIS — F329 Major depressive disorder, single episode, unspecified: Secondary | ICD-10-CM

## 2019-08-23 LAB — FUNGITELL, SERUM: Fungitell Result: <31 pg/mL (ref <80)

## 2019-08-23 NOTE — Telephone Encounter (Signed)
Patient husband called.  Patient was taken to [facility] and the paramedics asked for patient's medicines.  Patient's husband gave them a bag of all of her medications. They didn't give the medication back to him.  Patient came home from the hospital last night with no medication.  Patient's husband is requesting new prescriptions for the medicines that Anda Kraft prescribes.  I thiink it's Paxil 20 mg, Amlodipine 5 mg, Levetiracetam 1000 mg and metoprolol succinate 25 mg.  Patient especially needs the Paxil refilled.  Patient uses [facility]. Please call patient's husband when medications are refilled.

## 2019-08-23 NOTE — Telephone Encounter (Signed)
Ok to refill?   We have refilled amlodpine 5 mg, metoprolol 25 mg, and Paxil 20 mg.   However, not levetiracetam (KEPPRA) 1000 mg

## 2019-08-23 NOTE — Telephone Encounter (Signed)
[facility] ORDER called to Crystal at Hospice 

## 2019-08-24 ENCOUNTER — Telehealth: Payer: Self-pay

## 2019-08-24 LAB — CULTURE, BLOOD (ROUTINE X 2)
Culture: NO GROWTH
Culture: NO GROWTH
Special Requests: ADEQUATE
Special Requests: ADEQUATE

## 2019-08-24 MED ORDER — PAROXETINE HCL 20 MG PO TABS: ORAL_TABLET | ORAL | 0 refills | Status: AC

## 2019-08-24 MED ORDER — METOPROLOL SUCCINATE ER 25 MG PO TB24: 25.0000 mg | ORAL_TABLET | Freq: Every day | ORAL | 0 refills | Status: AC

## 2019-08-24 MED ORDER — AMLODIPINE BESYLATE 5 MG PO TABS: 5.0000 mg | ORAL_TABLET | Freq: Every day | ORAL | 0 refills | Status: AC

## 2019-08-24 NOTE — Telephone Encounter (Signed)
I will send refills for amlodipine, metoprolol, Paxil. I do not prescribe the Keppra, this is likely prescribed by her neurologist.

## 2019-08-24 NOTE — Telephone Encounter (Signed)
Message left for patient to return my call.  

## 2019-08-24 NOTE — Telephone Encounter (Signed)
Called patient to do TCM call, husband answered (ok per DPR on file).  I was not able to ask all the questions due to the situation and Mr Hasse been upset and tearful at this time. I apologized to him for calling and asking questions. Advised Mr Gruden if there is anything we can do to please let us know.  Transition Care Management Follow-up Telephone Call   Date discharged? 08/22/2019   How have you been since you were released from the hospital? Patient is not feeling well at all. Per husband patient is not accepting her diagnoses. They have hospice care at home now and has had several nurses come in and out to see patient from Hospice. Mr Waligorski is trying to get things set up as needed. Mr. Orecchio was very tearful on the phone.   Do you understand why you were in the hospital? yes   Do you understand the discharge instructions? patient not accepting her diagnoses or why hospice is there   Where were you discharged to? Home with husband and hospice care at home.   Items Reviewed:  Medications reviewed: not able to review with patient or Mr Tokarz.  Allergies reviewed: not able to review with patient or Mr Kaczanowski.  Dietary changes reviewed: not able to review with patient or Mr Arnstein.  Referrals reviewed: not able to review with patient or Mr [place].   Functional Questionnaire:   Activities of Daily Living (ADLs):   She states they are independent in the following: not able to fully go over this question with Mr Gagnon. States they require assistance with the following: Mr Stair states patient is awake about 4 hours a day, has not bathed, not doing anything.   Any transportation issues/concerns?: not able to review with patient or Mr [place].   Any patient concerns? not able to review with patient or Mr Heckmann.   Confirmed importance and date/time of follow-up visits scheduled NO  Confirmed with patient if condition begins to worsen call PCP or go to the ER.   Patient was given the office number and encouraged to call back with question or concerns.  : not able to review with patient or Mr Rauth.

## 2019-08-25 ENCOUNTER — Other Ambulatory Visit: Payer: Managed Care, Other (non HMO)

## 2019-08-25 ENCOUNTER — Other Ambulatory Visit: Payer: Self-pay

## 2019-08-25 ENCOUNTER — Ambulatory Visit: Payer: Managed Care, Other (non HMO) | Admitting: Oncology

## 2019-08-25 ENCOUNTER — Inpatient Hospital Stay: Payer: Managed Care, Other (non HMO) | Attending: Oncology | Admitting: Oncology

## 2019-08-25 ENCOUNTER — Ambulatory Visit: Payer: Managed Care, Other (non HMO)

## 2019-08-25 DIAGNOSIS — C7931 Secondary malignant neoplasm of brain: Secondary | ICD-10-CM | POA: Diagnosis not present

## 2019-08-25 NOTE — Progress Notes (Signed)
Left vm for patient's husband to return my phone call to initiate pt's virtual visit today. - Hugo with pt's husband. Patient currently under hospice services due to aggressively advanced cancer. Hospice nurses came out to open her up to the Services this week. Patient was no accepting her terminal illness; although, the husband is fully aware of the patient's prognosis. Husband states that her wife does not want to hear that "she is dying unless Dr. Grayland Ormond tell her this."

## 2019-08-28 NOTE — Progress Notes (Signed)
[facility]  [phone] [phone]  ID: Vickie Waters OB: 31-Mar-1966  MR#: [id number]  [id number]#:[id number]  Patient Care Team: Pleas Koch, NP as PCP - General (Nurse Practitioner)  I connected with Vickie Waters on 08/28/19 at  3:30 PM EDT by video enabled telemedicine visit and verified that I am speaking with the correct person using two identifiers.   I discussed the limitations, risks, security and privacy concerns of performing an evaluation and management service by telemedicine and the availability of in-person appointments. I also discussed with the patient that there may be a patient responsible charge related to this service. The patient expressed understanding and agreed to proceed.   Other persons participating in the visit and their role in the encounter: Patient, patient's husband, MD  Patients location: Home Providers location: Clinic  CHIEF COMPLAINT: Metastatic melanoma, BRAF-.  INTERVAL HISTORY: Patient agreed to video enabled telemedicine visit to further discuss her imaging results and prognosis.  She was recently admitted to the hospital with acute encephalopathy and noted to have rapidly progressive disease with leptomeningeal spread.  Her performance status has significantly declined and her husband reports that she sleeps most of the day.  She currently is awake and offers no complaints.  She denies any recent fevers or illnesses.  She has a good appetite and denies weight loss.  She has no chest pain, shortness of breath, cough, or hemoptysis.  She denies any nausea, vomiting, constipation, or diarrhea.  She has no urinary complaints.  Patient offers no further specific complaints today.  REVIEW OF SYSTEMS:   Review of Systems  Constitutional: Positive for malaise/fatigue. Negative for fever and weight loss.  HENT: Negative.  Negative for hearing loss, sinus pain, sore throat and tinnitus.   Eyes: Negative.  Negative  for blurred vision and double vision.  Respiratory: Negative.  Negative for cough, hemoptysis and shortness of breath.   Cardiovascular: Negative.  Negative for chest pain and leg swelling.  Gastrointestinal: Negative.  Negative for abdominal pain, blood in stool and melena.  Genitourinary: Negative.  Negative for dysuria and hematuria.  Musculoskeletal: Negative.  Negative for back pain and neck pain.  Skin: Negative.  Negative for rash.  Neurological: Negative.  Negative for dizziness, sensory change, speech change, focal weakness, seizures, weakness and headaches.  Psychiatric/Behavioral: Negative.  The patient is not nervous/anxious.     As per HPI. Otherwise, a complete review of systems is negative.  PAST MEDICAL HISTORY: Past Medical History:  Diagnosis Date   Allergy    Asthma    Cancer ([place])    Depression    Frequent headaches    Hypertension    Migraine     PAST SURGICAL HISTORY: Past Surgical History:  Procedure Laterality Date   CHOLECYSTECTOMY  2001    FAMILY HISTORY: Family History  Problem Relation Age of Onset   Arthritis Mother    Hypertension Mother    Hypertension Father    Diabetes Father    Mental illness Maternal Grandfather    Breast cancer Neg Hx     ADVANCED DIRECTIVES (Y/N):  N  HEALTH MAINTENANCE: Social History   Tobacco Use   Smoking status: Current Every Day Smoker    Packs/day: 1.00    Types: Cigarettes   Smokeless tobacco: Never Used  Substance Use Topics   Alcohol use: Yes    Alcohol/week: 0.0 standard drinks    Comment: social   Drug use: Never     Colonoscopy:  PAP:  Bone density:  Lipid panel:  Allergies  Allergen Reactions   Meropenem Hives    Within 3 hours of adminstration   Cephalosporins Hives   Penicillins Hives    Current Outpatient Medications  Medication Sig Dispense Refill   amLODipine (NORVASC) 5 MG tablet Take 1 tablet (5 mg total) by mouth daily. For blood pressure. 90  tablet 0   Blood Glucose Monitoring Suppl (ONE TOUCH ULTRA MINI) w/Device KIT Use as instructed to test blood sugar 2 times daily 1 each 0   dexamethasone (DECADRON) 4 MG tablet Take 1 tablet (4 mg total) by mouth 3 (three) times daily. 30 tablet 0   docusate sodium (COLACE) 100 MG capsule Take 1 capsule (100 mg total) by mouth 2 (two) times daily. 10 capsule 0   feeding supplement, ENSURE ENLIVE, (ENSURE ENLIVE) LIQD Take 237 mLs by mouth 3 (three) times daily between meals. 237 mL 12   glucose blood (ONE TOUCH ULTRA TEST) test strip USE ONE STRIP TO CHECK GLUCOSE TWICE DAILY 100 each 1   Lancet Devices (ONE TOUCH DELICA LANCING DEV) MISC Use as instructed to check blood sugar 2 times daily 100 each 11   lansoprazole (PREVACID) 30 MG capsule Take 1 capsule (30 mg total) by mouth daily at 12 noon. 30 capsule 3   levETIRAcetam (KEPPRA) 1000 MG tablet Take 1,000 mg by mouth 2 (two) times daily.      metoprolol succinate (TOPROL-XL) 25 MG 24 hr tablet Take 1 tablet (25 mg total) by mouth daily. For blood pressure. 90 tablet 0   ondansetron (ZOFRAN) 8 MG tablet Take 1 tablet (8 mg total) by mouth 2 (two) times daily. 30 tablet 0   ONETOUCH DELICA LANCETS FINE MISC Check blood sugar twice a day and as instructed. Dx E11.9 100 each 11   PARoxetine (PAXIL) 20 MG tablet TAKE 1 TABLET BY MOUTH ONCE DAILY FOR ANXIETY AND FOR DEPRESSION 90 tablet 0   polyethylene glycol (MIRALAX / GLYCOLAX) 17 g packet Take 17 g by mouth daily. 14 each 0   No current facility-administered medications for this visit.     OBJECTIVE: There were no vitals filed for this visit.   There is no height or weight on file to calculate BMI.    ECOG FS:3 - Symptomatic, >50% confined to bed  General: Ill-appearing, no acute distress. HEENT: Normocephalic. Neuro: Alert, answering all questions appropriately. Cranial nerves grossly intact. Psych: Normal affect.  LAB RESULTS:  Lab Results  Component Value Date   NA  138 08/20/2019   K 4.0 08/20/2019   CL 106 08/20/2019   CO2 24 08/20/2019   GLUCOSE 135 (H) 08/20/2019   BUN 16 08/20/2019   CREATININE 0.74 08/21/2019   CALCIUM 8.9 08/20/2019   PROT 6.9 08/16/2019   ALBUMIN 4.1 08/16/2019   AST 24 08/16/2019   ALT 39 08/16/2019   ALKPHOS 65 08/16/2019   BILITOT 1.3 (H) 08/16/2019   GFRNONAA >60 08/21/2019   GFRAA >60 08/21/2019    Lab Results  Component Value Date   WBC 11.6 (H) 08/20/2019   NEUTROABS 13.7 (H) 08/16/2019   HGB 13.7 08/20/2019   HCT 40.2 08/20/2019   MCV 89.5 08/20/2019   PLT 153 08/20/2019     STUDIES: Ct Head Wo Contrast  Result Date: 08/16/2019 CLINICAL DATA:  Altered mental status EXAM: CT HEAD WITHOUT CONTRAST TECHNIQUE: Contiguous axial images were obtained from the base of the skull through the vertex without intravenous contrast. COMPARISON:  06/08/2019 FINDINGS: Brain: Postoperative  right temporal lobe. Superficial to the craniectomy flap is a lobulated tense appearing fluid collection in the scalp and upper masticator space, measuring up to 6 x 3.6 cm on axial slices. No acute hemorrhage, hydrocephalus, or intracranial mass effect. Vascular: No hyperdense vessel or unexpected calcification. Skull: Right pterional craniectomy with cranioplasty. Sinuses/Orbits: Negative IMPRESSION: 1. No acute intracranial finding. 2. 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 Electronically Signed   By: Monte Fantasia M.D.   On: 08/16/2019 08:57   [name] BH Contrast  Result Date: 08/18/2019 CLINICAL DATA:  Metastatic melanoma. Postop craniotomy. Altered mental status. EXAM: MRI HEAD WITHOUT AND WITH CONTRAST TECHNIQUE: Multiplanar, multiecho pulse sequences of the brain and surrounding structures were obtained without and with intravenous contrast. CONTRAST:  42m GADAVIST GADOBUTROL 1 MMOL/ML IV SOLN COMPARISON:  CT head 08/16/2019.  MRI head with contrast 06/08/2019 FINDINGS:  Brain: Resection of tumor mass in the right temporal lobe since the prior MRI. Surgical encephalomalacia in the right inferior and anterior temporal lobe with irregular enhancement of the post surgical cavity in the right temporal lobe. Fluid extends through the craniotomy defect into the subgaleal tissues where there is a fluid collection measuring approximately 6.2 x 3.0 cm. 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 Significant progression in leptomeningeal enhancement diffusely and bilaterally. This also extends into the posterior fossa. Ventricle size normal.  No acute infarct.  No midline shift. Vascular: Normal arterial flow voids Skull and upper cervical spine: Right temporal craniotomy with pseudomeningocele as described above. Sinuses/Orbits: Negative Other: None IMPRESSION: Postsurgical resection of large right temporal lobe mass. Surgical fluid collection in the right temporal lobe extends through the craniotomy defect into the subgaleal tissues compatible with pseudomeningocele. Irregular enhancement of the leptomeninges has progressed significantly since 06/08/2019 compatible with leptomeningeal carcinomatosis. Electronically Signed   By: CFranchot GalloM.D.   On: 08/18/2019 17:45   Dg Chest Port 1 View  Result Date: 08/18/2019 CLINICAL DATA:  Somnolence. EXAM: PORTABLE CHEST 1 VIEW COMPARISON:  Radiograph 08/16/2019. PET CT 07/20/2019 FINDINGS: The cardiomediastinal contours are unchanged, upper normal heart size. No pulmonary edema. Slight increase in bibasilar atelectasis from prior. No consolidation, pleural effusion, or pneumothorax. No acute osseous abnormalities are seen. IMPRESSION: Slight increase in bibasilar atelectasis from prior. Otherwise no acute findings. Electronically Signed   By: MKeith RakeM.D.   On: 08/18/2019 22:47   Dg Chest Portable 1 View  Result Date: 08/16/2019 CLINICAL DATA:  Altered mental status EXAM: PORTABLE CHEST 1 VIEW  COMPARISON:  PET-CT July 20, 2019 FINDINGS: Lungs are clear. Heart is upper normal in size with pulmonary vascularity normal. No adenopathy. No bone lesions. IMPRESSION: No edema or consolidation. No evident adenopathy. Heart upper normal in size. Electronically Signed   By: WLowella GripIII M.D.   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  PLAN:    1.  Metastatic melanoma:  [facility]  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  BRAF mutation is negative.  PET scan results from July 20, 2019 reviewed independently with no hypermetabolic disease indicating a definitive primary.  Patient recently admitted to the hospital with encephalopathy and rapid progression of disease.  MRI of the brain on August 18, 2019 reviewed independently and reported as above with significant leptomeningeal spread of disease.  Patient's performance status is also declining rapidly.  It is unlikely she would tolerate any further treatments and has agreed to to enroll in hospice care.  No further follow-up is scheduled.   I provided 25 minutes of face-to-face video visit time during this encounter, and > 50% was spent counseling as documented under my assessment & plan.    Patient expressed understanding and was in agreement with this plan. She also understands that She can call clinic at any time with any questions, concerns, or complaints.   Cancer Staging Malignant melanoma metastatic to brain [facility]) Staging form: Melanoma of the Skin, AJCC 8th Edition - Clinical stage from 07/15/2019: Stage IV (cTX, cNX, cM1d(0)) - Signed by Lloyd Huger, MD on 07/15/2019   Lloyd Huger, MD   08/28/2019 6:48 PM

## 2019-09-02 ENCOUNTER — Ambulatory Visit: Payer: Managed Care, Other (non HMO) | Admitting: Oncology

## 2019-09-19 ENCOUNTER — Encounter: Payer: Self-pay | Admitting: *Deleted

## 2019-09-19 NOTE — Patient Instructions (Signed)
[employer] Dis[employer] forms completed, attending physicians statement updated. Forms scanned to clinic for MD signature and to be faxed.

## 2019-09-19 NOTE — Progress Notes (Unsigned)
Forms printed along with last MD note, recent med list, and cover sheet.  Given to Dr. Gary Fleet team to obtain MD signature and fax.

## 2019-09-22 ENCOUNTER — Ambulatory Visit: Payer: Managed Care, Other (non HMO) | Admitting: Radiation Oncology

## 2019-10-27 ENCOUNTER — Telehealth: Payer: Self-pay | Admitting: *Deleted

## 2019-10-27 NOTE — Telephone Encounter (Signed)
Mitzi with hospice called t report that patient expired 11/14/19 @ 4PM

## 2019-11-18 DEATH — deceased

## 2021-04-28 IMAGING — CT NM PET TUM IMG INITIAL (PI) WHOLE BODY
1 of 10 series · 2 of 25 positions shown · non-contrast
Comparison: 06/10/2019 chest abdomen and pelvic CTs. Brain MR
06/08/2019.

CLINICAL DATA: Initial treatment strategy for melanoma staging.
Known brain metastasis.

EXAM:
NUCLEAR MEDICINE PET WHOLE BODY
TECHNIQUE: 10.4 mCi F-18 FDG was injected intravenously. Full-ring PET imaging
was performed from the skull base to thigh after the radiotracer. CT
data was obtained and used for attenuation correction and anatomic
localization.
Fasting blood glucose: 110 mg/dl

[Series 3: ct wb 5.0 b30f · axial · 5.0mm · 0.98mm/px · z∈[-878,-276]mm · 2 of 603 slices shown]
[im 201/603  soft-tissue]
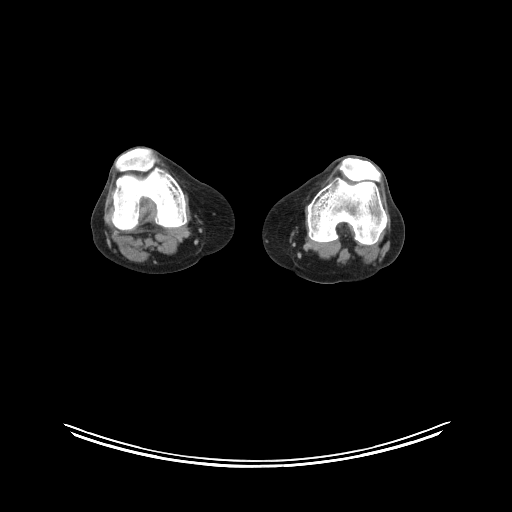
[im 402/603  soft-tissue]
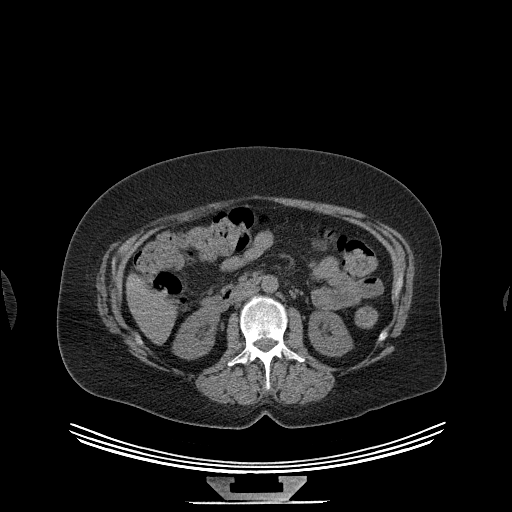

[2 of 25 positions shown; findings below may reference images not displayed]

FINDINGS: Mediastinal blood pool activity: SUV max

HEAD/NECK: Low-level hypermetabolism and soft tissue swelling about
the right frontal scalp. Just deep to this is the area of right
temporal lobe resection.

No cervical nodal hypermetabolism.

Incidental CT findings: No cervical adenopathy.

CHEST: No pulmonary parenchymal or thoracic nodal hypermetabolism.

Incidental CT findings: Deferred to recent diagnostic chest CT. No
superimposed acute process.

ABDOMEN/PELVIS: No abnormal abdominal hypermetabolism.

An isolated left inguinal node measures 8 mm and a S.U.V. max of
on image 295/3.

Incidental CT findings: Deferred to recent diagnostic CT.
Cholecystectomy. Normal adrenal glands. Low-density right renal
lesion was detailed on prior contrast enhanced CT, suboptimally
evaluated.

SKELETON: No abnormal marrow activity.

Incidental CT findings: none

EXTREMITIES: A focus of hypermetabolism about the right thenar
eminence is suboptimally evaluated on CT.

Incidental CT findings: Degenerative changes of both hips and
sacroiliac joints.
IMPRESSION: 1. No typical findings of hypermetabolic metastatic disease.
2. Hypermetabolism about the right thenar eminence warrants physical
exam correlation to exclude site of primary melanoma.
3. Hypermetabolic left inguinal node is favored to be reactive and
is not pathologic by size criteria. Recommend attention on
follow-up.
